# Patient Record
Sex: Female | Born: 1955 | Race: White | Hispanic: No | Marital: Married | Smoking: Former smoker
Health system: Southern US, Community
[De-identification: ages and names within clinical notes are randomized; demographics above are authoritative.]

## PROBLEM LIST (undated history)

## (undated) DIAGNOSIS — R5382 Chronic fatigue, unspecified: Secondary | ICD-10-CM

## (undated) DIAGNOSIS — E785 Hyperlipidemia, unspecified: Secondary | ICD-10-CM

## (undated) DIAGNOSIS — G43909 Migraine, unspecified, not intractable, without status migrainosus: Secondary | ICD-10-CM

## (undated) DIAGNOSIS — F32A Depression, unspecified: Secondary | ICD-10-CM

## (undated) DIAGNOSIS — F329 Major depressive disorder, single episode, unspecified: Secondary | ICD-10-CM

## (undated) DIAGNOSIS — M797 Fibromyalgia: Secondary | ICD-10-CM

## (undated) DIAGNOSIS — R7303 Prediabetes: Secondary | ICD-10-CM

## (undated) DIAGNOSIS — G4730 Sleep apnea, unspecified: Secondary | ICD-10-CM

## (undated) DIAGNOSIS — N6009 Solitary cyst of unspecified breast: Secondary | ICD-10-CM

## (undated) DIAGNOSIS — N920 Excessive and frequent menstruation with regular cycle: Secondary | ICD-10-CM

## (undated) DIAGNOSIS — I10 Essential (primary) hypertension: Secondary | ICD-10-CM

## (undated) HISTORY — DX: Essential (primary) hypertension: I10

## (undated) HISTORY — DX: Excessive and frequent menstruation with regular cycle: N92.0

## (undated) HISTORY — DX: Solitary cyst of unspecified breast: N60.09

## (undated) HISTORY — PX: TONSILLECTOMY: SUR1361

## (undated) HISTORY — DX: Hyperlipidemia, unspecified: E78.5

## (undated) HISTORY — DX: Depression, unspecified: F32.A

## (undated) HISTORY — DX: Major depressive disorder, single episode, unspecified: F32.9

## (undated) HISTORY — DX: Fibromyalgia: M79.7

## (undated) HISTORY — DX: Migraine, unspecified, not intractable, without status migrainosus: G43.909

## (undated) HISTORY — DX: Chronic fatigue, unspecified: R53.82

---

## 1997-12-27 ENCOUNTER — Ambulatory Visit (HOSPITAL_COMMUNITY): Admission: RE | Admit: 1997-12-27 | Discharge: 1997-12-27 | Payer: Self-pay | Admitting: Obstetrics & Gynecology

## 1998-08-23 HISTORY — PX: ABDOMINAL HYSTERECTOMY: SHX81

## 1998-11-27 ENCOUNTER — Other Ambulatory Visit: Admission: RE | Admit: 1998-11-27 | Discharge: 1998-11-27 | Payer: Self-pay | Admitting: Obstetrics & Gynecology

## 1999-01-22 ENCOUNTER — Inpatient Hospital Stay (HOSPITAL_COMMUNITY): Admission: RE | Admit: 1999-01-22 | Discharge: 1999-01-23 | Payer: Self-pay | Admitting: Obstetrics & Gynecology

## 2003-03-14 ENCOUNTER — Encounter: Admission: RE | Admit: 2003-03-14 | Discharge: 2003-03-14 | Payer: Self-pay

## 2003-10-09 ENCOUNTER — Emergency Department (HOSPITAL_COMMUNITY): Admission: EM | Admit: 2003-10-09 | Discharge: 2003-10-09 | Payer: Self-pay | Admitting: Emergency Medicine

## 2004-09-30 ENCOUNTER — Other Ambulatory Visit: Admission: RE | Admit: 2004-09-30 | Discharge: 2004-09-30 | Payer: Self-pay | Admitting: Obstetrics & Gynecology

## 2005-12-14 ENCOUNTER — Encounter: Admission: RE | Admit: 2005-12-14 | Discharge: 2005-12-14 | Payer: Self-pay

## 2006-11-21 ENCOUNTER — Emergency Department (HOSPITAL_COMMUNITY): Admission: EM | Admit: 2006-11-21 | Discharge: 2006-11-21 | Payer: Self-pay | Admitting: Emergency Medicine

## 2006-12-15 ENCOUNTER — Ambulatory Visit: Payer: Self-pay | Admitting: Gastroenterology

## 2006-12-16 ENCOUNTER — Encounter: Payer: Self-pay | Admitting: Gastroenterology

## 2006-12-16 LAB — CONVERTED CEMR LAB: Tissue Transglutaminase Ab, IgA: <3 units (ref <5)

## 2006-12-22 ENCOUNTER — Encounter: Admission: RE | Admit: 2006-12-22 | Discharge: 2006-12-22 | Payer: Self-pay | Admitting: Gastroenterology

## 2006-12-30 ENCOUNTER — Encounter: Payer: Self-pay | Admitting: Gastroenterology

## 2006-12-30 ENCOUNTER — Ambulatory Visit: Payer: Self-pay | Admitting: Gastroenterology

## 2007-02-13 ENCOUNTER — Ambulatory Visit: Payer: Self-pay | Admitting: Gastroenterology

## 2007-12-16 DIAGNOSIS — IMO0002 Reserved for concepts with insufficient information to code with codable children: Secondary | ICD-10-CM | POA: Insufficient documentation

## 2007-12-16 DIAGNOSIS — R7309 Other abnormal glucose: Secondary | ICD-10-CM

## 2007-12-16 DIAGNOSIS — IMO0001 Reserved for inherently not codable concepts without codable children: Secondary | ICD-10-CM | POA: Insufficient documentation

## 2007-12-16 DIAGNOSIS — E785 Hyperlipidemia, unspecified: Secondary | ICD-10-CM | POA: Insufficient documentation

## 2007-12-16 DIAGNOSIS — F341 Dysthymic disorder: Secondary | ICD-10-CM

## 2008-08-23 HISTORY — PX: TOTAL HIP ARTHROPLASTY: SHX124

## 2008-10-29 ENCOUNTER — Encounter: Admission: RE | Admit: 2008-10-29 | Discharge: 2008-10-29 | Payer: Self-pay | Admitting: Obstetrics and Gynecology

## 2010-09-13 ENCOUNTER — Encounter: Payer: Self-pay | Admitting: Gastroenterology

## 2011-01-05 NOTE — Assessment & Plan Note (Signed)
[facility]                         GASTROENTEROLOGY OFFICE NOTE   NAME:Bethel, KATALEENA HOLSAPPLE                       MRN:          [id number]  DATE:02/13/2007                            DOB:          11-10-1955    PROBLEM:  Diarrhea.   Ms. Molitor has returned for scheduled GI followup.  Colonoscopy on Dec 30, 2006 was remarkable only for a diminutive polyp.  Pathology showed  hyperplastic changes only.  Random biopsies were negative for  microscopic colitis.  Serologies for celiac sprue were also negative.   Ms. Bezdek reports cessation of her diarrhea with dietary changes.  She  appears to have loose stools when she eats large amounts of wheat and  sometimes chicken.  With dietary control her diarrhea has subsided  entirely.  Altogether she is feeling quite well.   PHYSICAL EXAMINATION:  VITAL SIGNS:  Pulse 88, blood pressure 120/72,  weight 195.   IMPRESSION:  Diarrhea.  This is certainly suggestive of irritable bowel  syndrome though symptoms also seem to be related to certain foodstuffs.   RECOMMENDATIONS:  No further GI workup.     Barbette Hair. Arlyce Dice, MD,FACG  Electronically Signed    RDK/MedQ  DD: 02/13/2007  DT: 02/13/2007  Job #: [id number]   cc:   Jonita Albee, M.D.  Allena Napoleon

## 2011-01-08 NOTE — Assessment & Plan Note (Signed)
[facility]                         GASTROENTEROLOGY OFFICE NOTE   NAME:Bebeau, SCOTT FIX                       MRN:          [id number]  DATE:12/15/2006                            DOB:          1955/09/27    PROBLEM:  1. Blood in stool.  2. Chronic diarrhea.  3. Intermittent right lower quadrant pain.   HISTORY:  Kaymarie is a pleasant 55 year old white female known to Dr.  Allena Napoleon and to Dr. Robert Bellow. She had an episode of rectal  bleeding on November 21, 2006, with bright red blood noted with diarrheal  stool. She went to the emergency room for evaluation. Labs were done and  were unremarkable with a normal hemoglobin of 14.8, hematocrit of 41.4,  MCV of 91, WBC of 10.1. CMET was likewise unremarkable. She also had CT  scan of the abdomen and pelvis because she had mentioned some right  lower quadrant abdominal pain. This was a negative study with oral and  IV contrast. She did have an indeterminate right hepatitic dome  hypodense lesion noted that is suspicious for a hemangioma, but MRI was  recommended. This lesion measures 2.3 x 1.9 cm.   In retrospect, the patient says she has had chronic problems with  diarrhea for at least the past year, normally having 3 to 4 loose bowel  movements per day. During that same period of time, she has also been  having intermittent right lower quadrant discomfort sometimes lasting  for a couple of days at a time. She had seen Dr. Alessandra Bevels for other  reasons and had been placed on what sounds like a gluten-free diet,  which she says was told was chosen on the basis of her blood type. She  is not aware that she has been tested for celiac disease and has not  been told that she has celiac disease, but feels that her symptoms are  much improved when she is on the no wheat, no grain diet. She had been  following this diet for several months with improvement of her symptoms,  had gone off of it and is now just  back on it for the past week or so.  She has not had any prior colonoscopy.   CURRENT MEDICATIONS:  1. Cymbalta 60 mg daily.  2. Benicar/hydrochlorothiazide 40/12.5 daily.  3. Zyrtec 10 q nightly.  4. Lipitor 20 q nightly.  5. Vitamins.  6. She takes Valtrex p.r.n.  7. Zyrtec p.r.n.  8. Mobic p.r.n.  9. Carisoprodol p.r.n.   ALLERGIES:  SULFA AND CODEINE.   PAST MEDICAL HISTORY:  1. Vaginal hysterectomy in 2000.  2. Tubal ligation in 1994.  3. She was diagnosed with fibromyalgia in 2003, but has not had any      recent problems.  4. Degenerative disc disease longterm.  5. Borderline diabetes, on no medications.  6. Hyperlipidemia.  7. Anxiety.  8. Seasonal allergies.  9. Depression.   FAMILY HISTORY:  Is pertinent for heart disease in her mother and  brother. Diabetes in mother and brother and alcoholism in uncle and a  cousin. No family history  of colon cancer, polyps or inflammatory bowel  disease. No family history of celiac disease that she is aware of.   SOCIAL HISTORY:  The patient is married. Has two children. Is employed  in [employer]. She is a smoker. Drinks alcohol socially.   REVIEW OF SYSTEMS:  Pertinent for chronic low back pain and arthritic  symptoms as well as seasonal allergy symptoms. GI as outlined above. GU:  Negative. CARDIOVASCULAR: Negative. PULMONARY: Negative. All other  review of systems reviewed in entirety and negative.   PHYSICAL EXAMINATION:  Well-developed, white female in no acute  distress. Height is 5 feet, 3.5 inches. Weight is 191.2, blood pressure  110/70, pulse in the 90s.  HEENT: Nontraumatic, normocephalic. EOMI. PERRLA. Sclerae anicteric.  NECK: Supple, without nodes.  CARDIOVASCULAR: Regular rate and rhythm with S1 and S2. There is no  murmur, rub or gallop.  PULMONARY: Clear to A&P.  ABDOMEN: Soft. She is basically nontender. There is no palpable mass or  hepatosplenomegaly. Bowel sounds are active.  RECTAL: Was not done  today. She was documented Heme positive in the  emergency room.  EXTREMITIES: Without clubbing, cyanosis or edema.  SKIN: Is benign with no rash or obvious lesions.  NEURO: Grossly nonfocal.   IMPRESSION:  68. A 55 year old white female with at least one year history of fairly      chronic diarrhea, recent episode of hematochezia, isolated and      intermittent right lower quadrant pain. Symptoms improved with      gluten-free diet, though no prior diagnosis of celiac disease.      Unclear whether her symptoms could be related to celiac disease,      underlying inflammatory bowel disease, occult colon lesion or a      microscopic colitis.  2. Right lobe liver lesion, probable hemangioma.   PLAN:  1. Schedule colonoscopy. If this is a negative examination, will need      random biopsies to rule out microscopic colitis.  2. Sprue panel. If markers are positive, will need to schedule EGD      with small bowel biopsy as      well.  3. Schedule MRI of the liver to assure that this right lobe liver      lesion is a hemangioma.      Mike Gip, PA-C  Electronically Signed      Barbette Hair. Arlyce Dice, MD,FACG  Electronically Signed   AE/MedQ  DD: 12/15/2006  DT: 12/15/2006  Job #: [id number]   cc:   Jonita Albee, M.D.  Allena Napoleon

## 2011-09-03 ENCOUNTER — Ambulatory Visit: Payer: Self-pay | Admitting: Rheumatology

## 2011-10-11 ENCOUNTER — Other Ambulatory Visit: Payer: Self-pay | Admitting: Family Medicine

## 2011-11-02 ENCOUNTER — Other Ambulatory Visit: Payer: Self-pay | Admitting: Physician Assistant

## 2011-11-04 ENCOUNTER — Ambulatory Visit (INDEPENDENT_AMBULATORY_CARE_PROVIDER_SITE_OTHER): Payer: BC Managed Care – PPO | Admitting: Family Medicine

## 2011-11-04 VITALS — BP 120/84 | HR 74 | Temp 98.5°F | Resp 18 | Ht 63.5 in | Wt 201.2 lb

## 2011-11-04 DIAGNOSIS — F419 Anxiety disorder, unspecified: Secondary | ICD-10-CM

## 2011-11-04 DIAGNOSIS — F411 Generalized anxiety disorder: Secondary | ICD-10-CM

## 2011-11-04 MED ORDER — CLONAZEPAM 0.5 MG PO TABS: ORAL_TABLET | ORAL | Status: AC

## 2011-11-04 NOTE — Progress Notes (Signed)
  Patient Name: Erika Burch Date of Birth: 10-Apr-1956 Medical Record Number: [id number] Gender: female Date of Encounter: 11/04/2011  History of Present Illness:  Erika Burch is a 56 y.o. very pleasant female patient who presents with the following:  Here today secondary to needing a RF of her klonopin.  She is stable on her klonopin .5 1/2 tablet 3x daily.  Her daughter Denny Peon has had a lot of problems lately with psycholocial problems and "psychogenic seizures."  Trinity currenly has about 2 drinks per night- this is a decrease from 3 drinks per day.  She is also trying to cut back on her smoking.  Overall she is stressed but feels that she is handling it ok- the klonopin helps.    She has lost about 10lbs so far through diet and exercise changes and is very pleased with her results.  Patient Active Problem List  Diagnoses  . HYPERLIPIDEMIA  . DEPRESSION/ANXIETY  . DEGENERATIVE DISC DISEASE  . FIBROMYALGIA  . DIABETES MELLITUS, BORDERLINE   No past medical history on file. No past surgical history on file. History  Substance Use Topics  . Smoking status: Current Everyday Smoker -- 1.0 packs/day for 13 years    Types: Cigarettes  . Smokeless tobacco: Never Used  . Alcohol Use: Not on file   No family history on file. Allergies  Allergen Reactions  . Aspirin Nausea And Vomiting  . Codeine   . Nickel Itching  . Sulfa Antibiotics Itching  . Tramadol Other (See Comments)    hallucinations    Medication list has been reviewed and updated.  Review of Systems: As per HPI- otherwise negative. Would like a 90d supply of her medication if possible.    Physical Examination: Filed Vitals:   11/04/11 1637  BP: 120/84  Pulse: 74  Temp: 98.5 F (36.9 C)  TempSrc: Oral  Resp: 18  Height: 5' 3.5" (1.613 m)  Weight: 201 lb 3.2 oz (91.264 kg)    Body mass index is 35.08 kg/(m^2).   GEN: WDWN, NAD, Non-toxic, Alert & Oriented x 3, obese HEENT: Atraumatic, Normocephalic.    Ears and Nose: No external deformity. EXTR: No clubbing/cyanosis/edema NEURO: Normal gait.  PSYCH: Normally interactive. Conversant. Not depressed or anxious appearing.  Calm demeanor.    Assessment and Plan: 1. Anxiety  clonazePAM (KLONOPIN) 0.5 MG tablet   Refilled her klonopin for 6 months today- she used 0.5mg - a half tablet TID.  She will plan to come in for her annual physical this summer or fall and we can check on her progress then- sooner if she has any worsening of her symptoms or changes

## 2011-11-09 ENCOUNTER — Other Ambulatory Visit: Payer: Self-pay | Admitting: Physician Assistant

## 2012-02-15 ENCOUNTER — Other Ambulatory Visit: Payer: Self-pay | Admitting: Physician Assistant

## 2013-01-29 ENCOUNTER — Telehealth: Payer: Self-pay | Admitting: Gastroenterology

## 2013-01-29 NOTE — Telephone Encounter (Signed)
Pt had colon done in May of 2008, a couple of polyps were removed but they were not adenomatous. Pt wants to know when she is due for a repeat colon. Please advise.

## 2013-01-30 NOTE — Telephone Encounter (Signed)
Spoke with pt and she is aware.

## 2013-01-30 NOTE — Telephone Encounter (Signed)
2018

## 2013-05-04 ENCOUNTER — Telehealth: Payer: Self-pay

## 2013-05-04 ENCOUNTER — Other Ambulatory Visit: Payer: BC Managed Care – PPO | Admitting: Certified Nurse Midwife

## 2013-05-04 DIAGNOSIS — Z1211 Encounter for screening for malignant neoplasm of colon: Secondary | ICD-10-CM

## 2013-05-04 NOTE — Telephone Encounter (Signed)
Patient notified of results.

## 2013-05-04 NOTE — Telephone Encounter (Signed)
IFOB neg LMTCB 

## 2013-05-04 NOTE — Telephone Encounter (Signed)
Patient returning Joy's call.  °

## 2013-08-06 ENCOUNTER — Encounter: Payer: Self-pay | Admitting: Certified Nurse Midwife

## 2013-08-07 ENCOUNTER — Ambulatory Visit: Payer: BC Managed Care – PPO | Admitting: Certified Nurse Midwife

## 2013-08-07 ENCOUNTER — Encounter: Payer: Self-pay | Admitting: Certified Nurse Midwife

## 2013-09-27 ENCOUNTER — Other Ambulatory Visit: Payer: Self-pay | Admitting: *Deleted

## 2013-09-27 MED ORDER — VALACYCLOVIR HCL 500 MG PO TABS: 500.0000 mg | ORAL_TABLET | ORAL | Status: DC | PRN

## 2013-09-27 NOTE — Telephone Encounter (Signed)
eScribe request from [facility] for refill on VALACYCLOVIR Last filled - 09/21/12, #30 X 11 Last AEX - 07/31/12 Next AEX - cancelled appt on 08/07/13.  Did not reschedule. Please advise refills.

## 2013-10-28 ENCOUNTER — Other Ambulatory Visit: Payer: Self-pay | Admitting: Certified Nurse Midwife

## 2013-10-29 NOTE — Telephone Encounter (Signed)
Patient needs to schedule AEX

## 2013-10-29 NOTE — Telephone Encounter (Signed)
Left Message To Call Back  

## 2013-10-30 NOTE — Telephone Encounter (Signed)
Scheduled patient for AEX 11/14/13 with DL Valtrex 500 mg #30/0 refills sent to last patient until AEX

## 2013-11-14 ENCOUNTER — Ambulatory Visit (INDEPENDENT_AMBULATORY_CARE_PROVIDER_SITE_OTHER): Payer: BC Managed Care – PPO | Admitting: Certified Nurse Midwife

## 2013-11-14 ENCOUNTER — Encounter: Payer: Self-pay | Admitting: Certified Nurse Midwife

## 2013-11-14 ENCOUNTER — Telehealth: Payer: Self-pay | Admitting: Certified Nurse Midwife

## 2013-11-14 VITALS — BP 130/90 | HR 84 | Resp 16 | Ht 63.25 in | Wt 207.0 lb

## 2013-11-14 DIAGNOSIS — B372 Candidiasis of skin and nail: Secondary | ICD-10-CM

## 2013-11-14 DIAGNOSIS — F1010 Alcohol abuse, uncomplicated: Secondary | ICD-10-CM

## 2013-11-14 DIAGNOSIS — Z0000 Encounter for general adult medical examination without abnormal findings: Secondary | ICD-10-CM

## 2013-11-14 DIAGNOSIS — Z01419 Encounter for gynecological examination (general) (routine) without abnormal findings: Secondary | ICD-10-CM

## 2013-11-14 LAB — POCT URINALYSIS DIPSTICK
BILIRUBIN UA: NEGATIVE
Blood, UA: NEGATIVE
Glucose, UA: NEGATIVE
Ketones, UA: NEGATIVE
LEUKOCYTES UA: NEGATIVE
NITRITE UA: NEGATIVE
PH UA: 5
PROTEIN UA: NEGATIVE
Urobilinogen, UA: NEGATIVE
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
## 2013-11-14 NOTE — Patient Instructions (Addendum)
EXERCISE AND DIET:  We recommended that you start or continue a regular exercise program for good health. Regular exercise means any activity that makes your heart beat faster and makes you sweat.  We recommend exercising at least 30 minutes per day at least 3 days a week, preferably 4 or 5.  We also recommend a diet low in fat and sugar.  Inactivity, poor dietary choices and obesity can cause diabetes, heart attack, stroke, and kidney damage, among others.    ALCOHOL AND SMOKING:  Women should limit their alcohol intake to no more than 7 drinks/beers/glasses of wine (combined, not each!) per week. Moderation of alcohol intake to this level decreases your risk of breast cancer and liver damage. And of course, no recreational drugs are part of a healthy lifestyle.  And absolutely no smoking or even second hand smoke. Most people know smoking can cause heart and lung diseases, but did you know it also contributes to weakening of your bones? Aging of your skin?  Yellowing of your teeth and nails?  CALCIUM AND VITAMIN D:  Adequate intake of calcium and Vitamin D are recommended.  The recommendations for exact amounts of these supplements seem to change often, but generally speaking 600 mg of calcium (either carbonate or citrate) and 800 units of Vitamin D per day seems prudent. Certain women may benefit from higher intake of Vitamin D.  If you are among these women, your doctor will have told you during your visit.    PAP SMEARS:  Pap smears, to check for cervical cancer or precancers,  have traditionally been done yearly, although recent scientific advances have shown that most women can have pap smears less often.  However, every woman still should have a physical exam from her gynecologist every year. It will include a breast check, inspection of the vulva and vagina to check for abnormal growths or skin changes, a visual exam of the cervix, and then an exam to evaluate the size and shape of the uterus and  ovaries.  And after 60 years of age, a rectal exam is indicated to check for rectal cancers. We will also provide age appropriate advice regarding health maintenance, like when you should have certain vaccines, screening for sexually transmitted diseases, bone density testing, colonoscopy, mammograms, etc.   MAMMOGRAMS:  All women over 40 years old should have a yearly mammogram. Many facilities now offer a "3D" mammogram, which may cost around $50 extra out of pocket. If possible,  we recommend you accept the option to have the 3D mammogram performed.  It both reduces the number of women who will be called back for extra views which then turn out to be normal, and it is better than the routine mammogram at detecting truly abnormal areas.    COLONOSCOPY:  Colonoscopy to screen for colon cancer is recommended for all women at age 50.  We know, you hate the idea of the prep.  We agree, BUT, having colon cancer and not knowing it is worse!!  Colon cancer so often starts as a polyp that can be seen and removed at colonscopy, which can quite literally save your life!  And if your first colonoscopy is normal and you have no family history of colon cancer, most women don't have to have it again for 10 years.  Once every ten years, you can do something that may end up saving your life, right?  We will be happy to help you get it scheduled when you are ready.    Be sure to check your insurance coverage so you understand how much it will cost.  It may be covered as a preventative service at no cost, but you should check your particular policy.     YOU are a strong woman and very special. You can make that step to AA !  DebbiYeast Infection of the Skin Some yeast on the skin is normal, but sometimes it causes an infection. If you have a yeast infection, it shows up as white or light brown patches on brown skin. You can see it better in the summer on tan skin. It causes light-colored holes in your suntan. It can happen  on any area of the body. This cannot be passed from person to person. HOME CARE  Scrub your skin daily with a dandruff shampoo. Your rash may take a couple weeks to get well.  Do not scratch or itch the rash. GET HELP RIGHT AWAY IF:   You get another infection from scratching. The skin may get warm, red, and may ooze fluid.  The infection does not seem to be getting better. MAKE SURE YOU:  Understand these instructions.  Will watch your condition.  Will get help right away if you are not doing well or get worse. Document Released: 07/22/2008 Document Revised: 11/01/2011 Document Reviewed: 07/22/2008 [facility] Patient Information 2014 [employer].

## 2013-11-15 NOTE — Progress Notes (Signed)
Reviewed personally.  M. Suzanne Brandonn Capelli, MD.  

## 2013-11-15 NOTE — Telephone Encounter (Signed)
Opened error

## 2013-12-02 ENCOUNTER — Other Ambulatory Visit: Payer: Self-pay | Admitting: Certified Nurse Midwife

## 2013-12-03 NOTE — Telephone Encounter (Signed)
Last refilled: 10/30/13 #30/0 rfs Last AEX: 11/14/13 with Ms. Lance Muss to send refills x1 year?  Please Advise.

## 2014-06-24 ENCOUNTER — Encounter: Payer: Self-pay | Admitting: Certified Nurse Midwife

## 2014-11-22 ENCOUNTER — Encounter: Payer: Self-pay | Admitting: Podiatry

## 2014-11-22 ENCOUNTER — Ambulatory Visit (INDEPENDENT_AMBULATORY_CARE_PROVIDER_SITE_OTHER): Payer: BLUE CROSS/BLUE SHIELD

## 2014-11-22 ENCOUNTER — Ambulatory Visit (INDEPENDENT_AMBULATORY_CARE_PROVIDER_SITE_OTHER): Payer: BLUE CROSS/BLUE SHIELD | Admitting: Podiatry

## 2014-11-22 VITALS — BP 117/75 | HR 117 | Resp 16 | Ht 63.5 in | Wt 210.0 lb

## 2014-11-22 DIAGNOSIS — M2012 Hallux valgus (acquired), left foot: Secondary | ICD-10-CM

## 2014-11-22 DIAGNOSIS — L600 Ingrowing nail: Secondary | ICD-10-CM

## 2014-11-22 NOTE — Progress Notes (Signed)
   Subjective:    Patient ID: Erika Burch, female    DOB: 1956/05/03, 59 y.o.   MRN: [id number]  HPI Toenail fungus, and the left great toe feels numb    Review of Systems  All other systems reviewed and are negative.      Objective:   Physical Exam        Assessment & Plan:

## 2014-11-22 NOTE — Patient Instructions (Signed)

## 2014-11-25 NOTE — Progress Notes (Signed)
Subjective:     Patient ID: Erika Burch, female   DOB: [birth year], 59 y.o.   MRN: [id number]  HPI patient presents stating I'm getting numbness and structural deformity of my left big toe joint and my nailbeds are yellow with some discoloration. Patient states that she's tried to soak them and use over-the-counter medications without relief   Review of Systems  All other systems reviewed and are negative.      Objective:   Physical Exam  Constitutional: She is oriented to person, place, and time.  Cardiovascular: Intact distal pulses.   Musculoskeletal: Normal range of motion.  Neurological: She is oriented to person, place, and time.  Skin: Skin is warm and dry.  Nursing note and vitals reviewed.  neurovascular status found to be intact with range of motion subtalar midtarsal joint within normal limits. Patient's noted to have hyperostosis medial aspect first metatarsal left and discoloration of the underlying nailbeds 1-5 both feet with some yellow friable type nailbeds. 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 At this point we'll start topical antifungal treatment and debridement and explained I do not recommend oral medication at the current time patient be seen back and also  we'll wider shoes in order to try to control structural deformity

## 2014-11-26 ENCOUNTER — Ambulatory Visit: Payer: BC Managed Care – PPO | Admitting: Certified Nurse Midwife

## 2014-11-27 ENCOUNTER — Ambulatory Visit
Admit: 2014-11-27 | Disposition: A | Discharge status: Home / Self Care | Payer: Self-pay | Attending: Family Medicine | Admitting: Family Medicine

## 2014-12-13 ENCOUNTER — Ambulatory Visit
Admit: 2014-12-13 | Disposition: A | Discharge status: Home / Self Care | Payer: Self-pay | Attending: Family Medicine | Admitting: Family Medicine

## 2014-12-17 ENCOUNTER — Telehealth: Payer: Self-pay | Admitting: *Deleted

## 2014-12-17 MED ORDER — CEPHALEXIN 500 MG PO CAPS: 500.0000 mg | ORAL_CAPSULE | Freq: Three times a day (TID) | ORAL | Status: DC

## 2014-12-17 NOTE — Telephone Encounter (Signed)
Patient saw dr Paulla Dolly for nail debridement , and her toe is red and swollen , has appointment on Thursday morning with dr Jacqualyn Posey can I have a antibiotic to get started on. Called in keflex to pharmacy

## 2014-12-19 ENCOUNTER — Ambulatory Visit (INDEPENDENT_AMBULATORY_CARE_PROVIDER_SITE_OTHER): Payer: BLUE CROSS/BLUE SHIELD | Admitting: Podiatry

## 2014-12-19 VITALS — BP 153/105 | HR 116 | Resp 16

## 2014-12-19 DIAGNOSIS — L089 Local infection of the skin and subcutaneous tissue, unspecified: Secondary | ICD-10-CM | POA: Diagnosis not present

## 2014-12-19 NOTE — Patient Instructions (Signed)
Switch to epsom salt soaks Finish course of antibiotics.  Monitor for any signs/symptoms of infection. Call the office immediately if any occur or go directly to the emergency room. Call with any questions/concerns.  Soak Instructions    THE DAY AFTER THE PROCEDURE  Place 1/4 cup of epsom salts in a quart of warm tap water.  Submerge your foot or feet with outer bandage intact for the initial soak; this will allow the bandage to become moist and wet for easy lift off.  Once you remove your bandage, continue to soak in the solution for 20 minutes.  This soak should be done twice a day.  Next, remove your foot or feet from solution, blot dry the affected area and cover.  You may use a band aid large enough to cover the area or use gauze and tape.  Apply other medications to the area as directed by the doctor such as polysporin neosporin.  IF YOUR SKIN BECOMES IRRITATED WHILE USING THESE INSTRUCTIONS, IT IS OKAY TO SWITCH TO  WHITE VINEGAR AND WATER. Or you may use antibacterial soap and water to keep the toe clean  Monitor for any signs/symptoms of infection. Call the office immediately if any occur or go directly to the emergency room. Call with any questions/concerns.

## 2014-12-23 NOTE — Progress Notes (Signed)
Patient ID: Erika Burch, female   DOB: May 19, 1956, 59 y.o.   MRN: [id number]  Subjective: Patient presents to the office today for follow up status post left big toenail removal. She states that she was doing well up until about a week ago she started having increased redness and pain to the toenail procedure site. She did call the office and Keflex was called in. She states that since starting the antibiotics her symptoms have improved although still somewhat red. She's been continuing to soaking antibacterial soap soaks daily. She denies any drainage or purulence. Denies any red streaking. No other complaints at this time.  Objective: AAO 3, NAD DP/PT pulses palpable b/l, CRT < 3 sec Protective sensation intact with SWMF Status post total nail removal to the left hallux. There is small amount erythema on the proximal nail border however there is no ascending cellulitis. There is nervous palpation gently over the nail bed. There is no areas of fluctuance or crepitus. No drainage or purulence expressed. A scab is formed over the nail bed. No other areas of tenderness to bilateral reduction raise. No other areas of edema, erythema, increase in warmth. No other open lesions or pre-ulcerative lesions No pain with calf compression, swelling, warmth, erythema.   Assessment: 59 year old female with apparent resolving infection s/p left hallux nail removal  Plan: -Treatment options were discussed with patient include alternatives, risks, complications -Recommended incision and antibiotic ointment and a Band-Aid daily after soaking in Epson salt soaks twice a day. Can leave the area uncover night. Finish course of antibiotics. Monitoring signs or symptoms of worsening infection and directed to call the office immediately if any occur or go to the ER. -Nail bed was debrided -Follow-up in 2 weeks at the symptoms are not completely resolved or sooner if any problems are to arise. In the meantime call the  office with any questions or concerns or any changes symptoms.

## 2015-01-02 ENCOUNTER — Ambulatory Visit: Payer: BLUE CROSS/BLUE SHIELD | Admitting: Podiatry

## 2015-01-03 ENCOUNTER — Ambulatory Visit: Payer: BLUE CROSS/BLUE SHIELD | Admitting: Podiatry

## 2015-01-03 ENCOUNTER — Other Ambulatory Visit: Payer: Self-pay | Admitting: Certified Nurse Midwife

## 2015-01-03 NOTE — Telephone Encounter (Signed)
Medication refill request: Valtrex  Last AEX:  11/14/13 DL Next AEX: not scheduled  Last MMG (if hormonal medication request): 12/13/14 BIRADS3:Probably Benign  Refill authorized: 12/03/13 #30tabs/ 12 R. Today #30/0R?

## 2015-01-03 NOTE — Telephone Encounter (Signed)
Needs schedule aex, one refill authorized

## 2015-02-07 ENCOUNTER — Other Ambulatory Visit: Payer: Self-pay | Admitting: Certified Nurse Midwife

## 2015-02-07 NOTE — Telephone Encounter (Signed)
Medication refill request: Valtrex 500 mg  Last AEX: 11/14/13 with DL  Next AEX: No AEX scheduled Last MMG (if hormonal medication request): n/a Refill authorized: 0  Called and s/w patient to try and schedule her for AEX since she is due. Patient says she no longer lives in [place] and stays in [place] now. She asked if we could have pharmacy forward this rx to Eulogio Bear, MD in [place].  Rx denied with instruction to forward refill to Dr. Dema Severin, routed to Ms. Delorse Limber.

## 2015-05-22 ENCOUNTER — Other Ambulatory Visit: Payer: Self-pay | Admitting: Family Medicine

## 2015-05-22 DIAGNOSIS — R928 Other abnormal and inconclusive findings on diagnostic imaging of breast: Secondary | ICD-10-CM

## 2016-04-22 ENCOUNTER — Other Ambulatory Visit: Payer: Self-pay | Admitting: Family Medicine

## 2016-04-22 DIAGNOSIS — R928 Other abnormal and inconclusive findings on diagnostic imaging of breast: Secondary | ICD-10-CM

## 2016-05-21 ENCOUNTER — Ambulatory Visit
Admission: RE | Admit: 2016-05-21 | Discharge: 2016-05-21 | Disposition: A | Discharge status: Home / Self Care | Payer: Medicare HMO | Source: Ambulatory Visit | Attending: Family Medicine | Admitting: Family Medicine

## 2016-05-21 DIAGNOSIS — Z1231 Encounter for screening mammogram for malignant neoplasm of breast: Secondary | ICD-10-CM | POA: Diagnosis present

## 2016-05-21 DIAGNOSIS — R928 Other abnormal and inconclusive findings on diagnostic imaging of breast: Secondary | ICD-10-CM

## 2016-11-18 ENCOUNTER — Encounter: Payer: Self-pay | Admitting: Gastroenterology

## 2017-08-24 ENCOUNTER — Other Ambulatory Visit: Payer: Self-pay | Admitting: Family Medicine

## 2017-08-24 DIAGNOSIS — Z1231 Encounter for screening mammogram for malignant neoplasm of breast: Secondary | ICD-10-CM

## 2017-09-07 ENCOUNTER — Ambulatory Visit: Payer: Medicare HMO | Admitting: Obstetrics and Gynecology

## 2017-09-07 ENCOUNTER — Other Ambulatory Visit (HOSPITAL_COMMUNITY)
Admission: RE | Admit: 2017-09-07 | Discharge: 2017-09-07 | Disposition: A | Discharge status: Home / Self Care | Payer: Medicare HMO | Source: Ambulatory Visit | Attending: Obstetrics and Gynecology | Admitting: Obstetrics and Gynecology

## 2017-09-07 ENCOUNTER — Encounter: Payer: Self-pay | Admitting: Obstetrics and Gynecology

## 2017-09-07 DIAGNOSIS — Z01419 Encounter for gynecological examination (general) (routine) without abnormal findings: Secondary | ICD-10-CM

## 2017-09-07 DIAGNOSIS — N898 Other specified noninflammatory disorders of vagina: Secondary | ICD-10-CM

## 2017-09-07 DIAGNOSIS — Z01411 Encounter for gynecological examination (general) (routine) with abnormal findings: Secondary | ICD-10-CM | POA: Diagnosis not present

## 2017-09-07 DIAGNOSIS — N760 Acute vaginitis: Secondary | ICD-10-CM | POA: Insufficient documentation

## 2017-09-07 DIAGNOSIS — B9689 Other specified bacterial agents as the cause of diseases classified elsewhere: Secondary | ICD-10-CM

## 2017-09-07 MED ORDER — NYSTATIN-TRIAMCINOLONE 100000-0.1 UNIT/GM-% EX OINT: 1.0000 "application " | TOPICAL_OINTMENT | Freq: Two times a day (BID) | CUTANEOUS | 0 refills | Status: AC

## 2017-09-07 NOTE — Patient Instructions (Signed)
Nystatin; Triamcinolone cream or ointment What is this medicine? NYSTATIN; TRIAMCINOLONE (nye STAT in; trye am SIN oh lone) is a combination of an antifungal medicine and a steroid. It is used to treat certain kinds of fungal or yeast infections of the skin. This medicine may be used for other purposes; ask your health care provider or pharmacist if you have questions. COMMON BRAND NAME(S): Myco-Triacet-II, Mycogen-II, Mycolog II, Mytrex, N.T.A. What should I tell my health care provider before I take this medicine? They need to know if you have any of these conditions: -large areas of burned or damaged skin -skin wasting or thinning -peripheral vascular disease or poor circulation -an unusual or allergic reaction to nystatin, triamcinolone, other corticosteroids, other medicines, foods, dyes, or preservatives -pregnant or trying to get pregnant -breast-feeding How should I use this medicine? This medicine is for external use only. Do not take by mouth. Follow the directions on the prescription label. Wash your hands before and after use. If treating hand or nail infections, wash hands before use only. Apply a thin layer of this medicine to the affected area and rub in gently. Do not use on healthy skin or over large areas of skin. Do not get this medicine in your eyes. If you do, rinse out with plenty of cool tap water. When applying to the groin area, apply a limited amount and do not use for longer than 2 weeks unless directed to by your doctor or health care professional. Do not cover or wrap the treated area with an airtight bandage (such as a plastic bandage). Use the full course of treatment prescribed, even if you think the infection is getting better. Use at regular intervals. Do not use your medicine more often than directed. Do not use this medicine for any condition other than the one for which it was prescribed. Talk to your pediatrician regarding the use of this medicine in children.  While this drug may be prescribed for selected conditions, precautions do apply. Children being treated in the diaper area should not wear tight-fitting diapers or plastic pants. Elderly patients are more likely to have damaged skin through aging, and this may increase side effects. This medicine should only be used for brief periods and infrequently in older patients. Overdosage: If you think you have taken too much of this medicine contact a poison control center or emergency room at once. NOTE: This medicine is only for you. Do not share this medicine with others. What if I miss a dose? If you miss a dose, use it as soon as you can. If it is almost time for your next dose, use only that dose. Do not use double or extra doses. What may interact with this medicine? Interactions are not expected. Do not use any other skin products on the affected area without telling your doctor or health care professional. This list may not describe all possible interactions. Give your health care provider a list of all the medicines, herbs, non-prescription drugs, or dietary supplements you use. Also tell them if you smoke, drink alcohol, or use illegal drugs. Some items may interact with your medicine. What should I watch for while using this medicine? Tell your doctor or health care professional if your symptoms do not start to get better within 1 week when treating the groin area or within 2 weeks when treating the feet. . Tell your doctor or health care professional if you develop sores or blisters that do not heal properly. If your skin  infection returns after stopping this medicine, contact your doctor or health care professional. If you are using this medicine to treat an infection in the groin area, do not wear underwear that is tight-fitting or made from synthetic fibers such as rayon or nylon. Instead, wear loose-fitting, cotton underwear. Also dry the area completely after bathing. What side effects may I  notice from receiving this medicine? Side effects that you should report to your doctor or health care professional as soon as possible: -burning or itching of the skin -dark red spots on the skin -loss of feeling on skin -painful, red, pus-filled blisters in hair follicles -skin infection -thinning of the skin or sunburn: more likely if applied to the face Side effects that usually do not require medical attention (report to your doctor or health care professional if they continue or are bothersome): -dry or peeling skin -skin irritation This list may not describe all possible side effects. Call your doctor for medical advice about side effects. You may report side effects to FDA at 1-800-FDA-1088. Where should I keep my medicine? Keep out of the reach of children. Store at room temperature between 15 and 30 degrees C (59 and 86 degrees F). Do not freeze. Throw away any unused medicine after the expiration date. NOTE: This sheet is a summary. It may not cover all possible information. If you have questions about this medicine, talk to your doctor, pharmacist, or health care provider.  2018 Elsevier/Gold Standard (2008-03-01 17:29:26)

## 2017-09-07 NOTE — Progress Notes (Signed)
Mammogram scheduled for 09/12/2017 Vaginal itching for couple of days

## 2017-09-08 LAB — CERVICOVAGINAL ANCILLARY ONLY
Bacterial vaginitis: POSITIVE — AB
CHLAMYDIA, DNA PROBE: NEGATIVE
Candida vaginitis: NEGATIVE
Neisseria Gonorrhea: NEGATIVE
Trichomonas: NEGATIVE

## 2017-09-09 ENCOUNTER — Telehealth: Payer: Self-pay | Admitting: Obstetrics and Gynecology

## 2017-09-09 DIAGNOSIS — B9689 Other specified bacterial agents as the cause of diseases classified elsewhere: Secondary | ICD-10-CM | POA: Insufficient documentation

## 2017-09-09 DIAGNOSIS — N760 Acute vaginitis: Secondary | ICD-10-CM

## 2017-09-09 MED ORDER — METRONIDAZOLE 500 MG PO TABS: 500.0000 mg | ORAL_TABLET | Freq: Two times a day (BID) | ORAL | 0 refills | Status: AC

## 2017-09-09 NOTE — Telephone Encounter (Signed)
TC to patient to notify of wet prep results and Rx sent to her pharmacy for Flagyl 500 mg BID x 7 days. Patient verbalized an understanding of the plan of care and agrees.  Laury Deep, MSN, CNM 09/09/2017 2:16 PM

## 2017-09-09 NOTE — Progress Notes (Signed)
GYNECOLOGY CLINIC ANNUAL PREVENTATIVE CARE ENCOUNTER NOTE  Subjective:   Erika Burch is a 62 y.o. [phone] female here for a routine annual gynecologic exam.  Current complaints: vaginal irritation.   Denies abnormal vaginal bleeding, discharge, pelvic pain, problems with intercourse or other gynecologic concerns.    Gynecologic History Patient's last menstrual period was 08/23/1998. -- S/P hysterectomy x 19 years Contraception: status post hysterectomy -- "not sexually active" Last Pap: 2000. Results were: normal per pt Last mammogram: 2018, scheduled for one 09/12/2017. Results were: normal  Obstetric History OB History  Gravida Para Term Preterm AB Living  4 2 2   2 2   SAB TAB Ectopic Multiple Live Births  2       2    # Outcome Date GA Lbr Len/2nd Weight Sex Delivery Anes PTL Lv  4 SAB           3 SAB           2 Term     F CS-Classical   LIV  1 Term     M Vag-Spont   LIV      Past Medical History:  Diagnosis Date  . Breast cyst    left  . Chronic fatigue   . Depression   . Fibromyalgia   . Hyperlipidemia   . Hypertension   . Menorrhagia   . 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 ABDOMINAL HYSTERECTOMY  2000   TAH, has ovaries  . CESAREAN SECTION    . TONSILLECTOMY    . TOTAL HIP ARTHROPLASTY Right 2010    Current Outpatient Medications on File Prior to Visit  Medication Sig Dispense Refill  . amLODipine (NORVASC) 5 MG tablet Take 5 mg by mouth daily.    Marland Kitchen atorvastatin (LIPITOR) 20 MG tablet Take 1 tablet (20 mg total) by mouth daily. NEEDS OFFICE VISIT/LABS FOR REFILLS 30 tablet 0  . buPROPion (WELLBUTRIN) 75 MG tablet Take 75 mg by mouth daily.    . clonazePAM (KLONOPIN) 0.5 MG tablet 1/2 tablet 3 times daily 135 tablet 1  . DULoxetine (CYMBALTA) 60 MG capsule Take 60 mg by mouth daily.    . ergocalciferol (VITAMIN D2) 50000 UNITS capsule Take 50,000 Units by mouth every 30 (thirty) days.    . fluticasone  (FLONASE) 50 MCG/ACT nasal spray Place into both nostrils daily.    . folic acid (FOLVITE) 016 MCG tablet Take 400 mcg by mouth daily.    . hydrochlorothiazide (HYDRODIURIL) 25 MG tablet Take 25 mg by mouth daily.    Marland Kitchen HYDROcodone-acetaminophen (VICODIN) 5-500 MG per tablet Take 1 tablet by mouth every 6 (six) hours as needed.    . loratadine (CLARITIN) 10 MG tablet Take 10 mg by mouth daily.    . valACYclovir (VALTREX) 500 MG tablet TAKE 1 TABLET BY MOUTH AS NEEDED 30 tablet 0  . cephALEXin (KEFLEX) 500 MG capsule Take 1 capsule (500 mg total) by mouth 3 (three) times daily. (Patient not taking: Reported on 09/07/2017) 30 capsule 0   No current facility-administered medications on file prior to visit.     Allergies  Allergen Reactions  . Aspirin Nausea And Vomiting  . Codeine   . Losartan Cough  . Nickel Itching  . Other     opiates  . Sulfa Antibiotics Itching  . Tramadol Other (See Comments)    hallucinations    Social History   Socioeconomic History  . Marital  status: Married    Spouse name: Not on file  . Number of children: Not on file  . Years of education: Not on file  . Highest education level: Not on file  Social Needs  . Financial resource strain: Not on file  . Food insecurity - worry: Not on file  . Food insecurity - inability: Not on file  . Transportation needs - medical: Not on file  . Transportation needs - non-medical: Not on file  Occupational History  . Not on file  Tobacco Use  . Smoking status: Current Every Day Smoker    Packs/day: 1.00    Years: 13.00    Pack years: 13.00    Types: Cigarettes  . Smokeless tobacco: Never Used  . Tobacco comment: vape  Substance and Sexual Activity  . Alcohol use: Yes    Alcohol/week: 15.0 oz    Types: 30 drink(s) per week  . Drug use: No  . Sexual activity: No    Partners: Male    Birth control/protection: Surgical    Comment: TAH has ovaries  Other Topics Concern  . Not on file  Social History  Narrative  . Not on file    Family History  Problem Relation Age of Onset  . Diabetes Mother   . Heart attack Mother   . Mesothelioma Father   . Heart attack Father   . Hypertension Sister   . Diabetes Sister   . Heart attack Sister   . Diabetes Brother   . Heart attack Brother   . Diabetes Maternal Grandmother     The following portions of the patient's history were reviewed and updated as appropriate: allergies, current medications, past family history, past medical history, past social history, past surgical history and problem list.  Review of Systems A comprehensive review of systems was negative except for: Genitourinary: positive for vaginal irritation   Objective:  BP 119/67   Pulse 72   Wt 248 lb (112.5 kg)   LMP 08/23/1998   BMI 43.24 kg/m  CONSTITUTIONAL: Well-developed, well-nourished female in no acute distress.  HENT:  Normocephalic, atraumatic, External right and left ear normal. Oropharynx is clear and moist EYES: Conjunctivae and EOM are normal. Pupils are equal, round, and reactive to light. No scleral icterus.  NECK: Normal range of motion, supple, no masses.  Normal thyroid.  SKIN: Skin is warm and dry. No rash noted. Not diaphoretic. No erythema. No pallor. [place]: Alert and oriented to person, place, and time. Normal reflexes, muscle tone coordination. No cranial nerve deficit noted. PSYCHIATRIC: Normal mood and affect. Normal behavior. Normal judgment and thought content. CARDIOVASCULAR: Normal heart rate noted, regular rhythm RESPIRATORY: Clear to auscultation bilaterally. Effort and breath sounds normal, no problems with respiration noted. BREASTS: Symmetric in size. No masses, skin changes, nipple drainage, or lymphadenopathy. ABDOMEN: Soft, moderate pannus, normal bowel sounds, no distention noted.  No tenderness, rebound or guarding.  PELVIC: Normal appearing external genitalia; normal appearing vaginal mucosa and cervix.  No abnormal discharge  noted.  Pap smear obtained.  Normal uterine size, no other palpable masses, no uterine or adnexal tenderness. MUSCULOSKELETAL: Normal range of motion. No tenderness.  No cyanosis, clubbing, or edema.  2+ distal pulses.   Assessment:  Annual gynecologic examination without pap smear; wet prep performed   Plan:   Will follow up results of wet prep and manage accordingly. Mammogram scheduled for 09/12/2017 Routine preventative health maintenance measures emphasized. Please refer to After Visit Summary for other counseling recommendations.  Laury Deep, CNM  09/07/2017 2:03 PM

## 2017-09-12 ENCOUNTER — Ambulatory Visit
Admission: RE | Admit: 2017-09-12 | Discharge: 2017-09-12 | Disposition: A | Discharge status: Home / Self Care | Payer: Medicare HMO | Source: Ambulatory Visit | Attending: Family Medicine | Admitting: Family Medicine

## 2017-09-12 DIAGNOSIS — Z1231 Encounter for screening mammogram for malignant neoplasm of breast: Secondary | ICD-10-CM | POA: Diagnosis not present

## 2017-09-14 ENCOUNTER — Other Ambulatory Visit: Payer: Self-pay

## 2017-09-14 MED ORDER — METRONIDAZOLE 0.75 % VA GEL: 1.0000 | Freq: Two times a day (BID) | VAGINAL | 0 refills | Status: AC

## 2017-09-14 NOTE — Telephone Encounter (Signed)
Patient called stating she was having some neck and back pain and she thinks it could be related to flagyl that was called into her pharmacy for BV. She is requesting another medication be called into the pharmacy. Per Dr.Newton we can call metrogel for patient to used instead of flagyl. Patient medication called into her [place].

## 2018-07-05 ENCOUNTER — Encounter: Payer: Self-pay | Admitting: Radiology

## 2018-08-31 ENCOUNTER — Emergency Department
Admission: EM | Admit: 2018-08-31 | Discharge: 2018-08-31 | Disposition: A | Discharge status: Home / Self Care | Payer: Medicare HMO | Attending: Emergency Medicine | Admitting: Emergency Medicine

## 2018-08-31 ENCOUNTER — Emergency Department: Payer: Medicare HMO

## 2018-08-31 ENCOUNTER — Encounter: Payer: Self-pay | Admitting: Emergency Medicine

## 2018-08-31 DIAGNOSIS — M79602 Pain in left arm: Secondary | ICD-10-CM

## 2018-08-31 DIAGNOSIS — M79622 Pain in left upper arm: Secondary | ICD-10-CM | POA: Diagnosis not present

## 2018-08-31 DIAGNOSIS — F17210 Nicotine dependence, cigarettes, uncomplicated: Secondary | ICD-10-CM | POA: Insufficient documentation

## 2018-08-31 DIAGNOSIS — I10 Essential (primary) hypertension: Secondary | ICD-10-CM | POA: Diagnosis not present

## 2018-08-31 MED ORDER — IBUPROFEN 400 MG PO TABS
400.0000 mg | ORAL_TABLET | Freq: Once | ORAL | Status: AC
  Administered 2018-08-31: 400 mg via ORAL
  Filled 2018-08-31: qty 1

## 2018-08-31 NOTE — ED Provider Notes (Addendum)
[facility] Emergency Department Provider Note  ____________________________________________  Time seen: Approximately 8:31 PM  I have reviewed the triage vital signs and the nursing notes.   HISTORY  Chief Complaint Arm Pain    HPI Erika Burch is a 63 y.o. female with a history of HTN, HL, presenting for left upper extremity pain.  The patient reports that she woke up this morning with a dull ache in the upper part of her left arm.  She did not have any associated chest pain, shortness of breath, palpitations, lightheadedness or syncope.  She did not have any trauma or new strain.  She did not have any overlying skin changes.  She tried Tylenol and heating pad without improvement and a cold pack made it worse.  She denies any neck pain, numbness tingling or weakness.  SH: Former smoker.  Now vaping.  Past Medical History:  Diagnosis Date  . Breast cyst    left  . Chronic fatigue   . Depression   . Fibromyalgia   . Hyperlipidemia   . Hypertension   . Menorrhagia   . Migraines    without aura    Patient Active Problem List   Diagnosis Date Noted  . Bacterial vaginitis 09/09/2017  . Vaginal irritation 09/07/2017  . Encounter for well woman exam with routine gynecological exam 09/07/2017  . Alcohol abuse 11/14/2013    Class: History of  . HYPERLIPIDEMIA 12/16/2007  . DEPRESSION/ANXIETY 12/16/2007  . DEGENERATIVE DISC DISEASE 12/16/2007  . FIBROMYALGIA 12/16/2007  . DIABETES MELLITUS, BORDERLINE 12/16/2007    Past Surgical History:  Procedure Laterality Date  . ABDOMINAL HYSTERECTOMY  2000   TAH, has ovaries  . CESAREAN SECTION    . TONSILLECTOMY    . TOTAL HIP ARTHROPLASTY Right 2010    Current Outpatient Rx  . Order #: [id number] Class: Historical Med  . Order #: [id number] Class: Normal  . Order #: [id number] Class: Historical Med  . Order #: [id number] Class: Normal  . Order #: [id number] Class: Print  . Order #: [id number] Class: Historical Med   . Order #: [id number] Class: Historical Med  . Order #: [id number] Class: Historical Med  . Order #: [id number] Class: Historical Med  . Order #: [id number] Class: Historical Med  . Order #: [id number] Class: Historical Med  . Order #: [id number] Class: Historical Med  . Order #: [id number] Class: Normal  . Order #: [id number] Class: Normal  . Order #: [id number] Class: Normal  . Order #: [id number] Class: Normal    Allergies Aspirin; Codeine; Losartan; Nickel; Other; Sulfa antibiotics; and Tramadol  Family History  Problem Relation Age of Onset  . Diabetes Mother   . Heart attack Mother   . Mesothelioma Father   . Heart attack Father   . Hypertension Sister   . Diabetes Sister   . Heart attack Sister   . Diabetes Brother   . Heart attack Brother   . Diabetes Maternal Grandmother     Social History Social History   Tobacco Use  . Smoking status: Current Every Day Smoker    Packs/day: 1.00    Years: 13.00    Pack years: 13.00    Types: Cigarettes  . Smokeless tobacco: Never Used  . Tobacco comment: vape  Substance Use Topics  . Alcohol use: Yes    Alcohol/week: 30.0 standard drinks    Types: 30 drink(s) per week  . Drug use: No    Review of Systems Constitutional: No fever/chills.  No lightheadedness or syncope.  No trauma. Eyes: No visual changes. ENT: No sore throat.  No congestion or rhinorrhea. Cardiovascular: Denies chest pain. Denies palpitations. Respiratory: Denies shortness of breath.  No cough. Gastrointestinal: No abdominal pain.  No nausea, no vomiting.  No diarrhea.  No constipation. Genitourinary: Negative for dysuria. Musculoskeletal: Negative for back pain.  No neck pain.  Positive left upper extremity pain. Skin: Negative for rash. Neurological: Negative for headaches. No focal numbness, tingling or weakness.     ____________________________________________   PHYSICAL EXAM:  VITAL SIGNS: ED Triage Vitals  Enc Vitals Group     BP 08/31/18 1701 (!) 124/96      Pulse Rate 08/31/18 1701 96     Resp 08/31/18 1701 18     Temp 08/31/18 1701 98.9 F (37.2 C)     Temp Source 08/31/18 1701 Oral     SpO2 08/31/18 1701 94 %     Weight 08/31/18 1702 234 lb (106.1 kg)     Height 08/31/18 1702 5\' 4"  (1.626 m)     Head Circumference --      Peak Flow --      Pain Score 08/31/18 1702 7     Pain Loc --      Pain Edu? --      Excl. in [place]? --     Constitutional: Alert and oriented. Answers questions appropriately.  Chronically ill-appearing. Eyes: Conjunctivae are normal.  EOMI. No scleral icterus. Head: Atraumatic. Nose: No congestion/rhinnorhea. Mouth/Throat: Mucous membranes are moist.  Neck: No stridor.  Supple.  No JVD.  No meningismus. Cardiovascular: Normal rate, regular rhythm. No murmurs, rubs or gallops.  Respiratory: Normal respiratory effort.  No accessory muscle use or retractions. Lungs CTAB.  No wheezes, rales or ronchi. Gastrointestinal: Obese.  Soft, nontender and nondistended.  No guarding or rebound.  No peritoneal signs. Musculoskeletal: The patient has full range of motion of the left wrist, elbow and shoulder.  When I palpate the lower part of the deltoid, it reproduces the patient's pain.  She has no overlying skin changes.  She has no evidence of swelling in the left upper extremity.  The left radial pulse is normal.  She has 5 out of 5 grip strength in the left upper extremity and a normal sensation to light touch.   Neurologic:  A&Ox3.  Speech is clear.  Face and smile are symmetric.  EOMI.  Moves all extremities well. Skin:  Skin is warm, dry and intact. No rash noted.   ____________________________________________   LABS (all labs ordered are listed, but only abnormal results are displayed)  Labs Reviewed - No data to display ____________________________________________  EKG  ED ECG REPORT I, Anne-Caroline Mariea Clonts, the attending physician, personally viewed and interpreted this ECG.   Date: 08/31/2018  EKG Time: 2106   Rate: 89  Rhythm: normal sinus rhythm  Axis: leftward  Intervals:none  ST&T Change: No STEMI  ____________________________________________  RADIOLOGY  US Venous Img Upper Uni Left  Result Date: 08/31/2018 CLINICAL DATA:  Acute left arm pain EXAM: LEFT UPPER EXTREMITY VENOUS DOPPLER ULTRASOUND TECHNIQUE: Gray-scale sonography with graded compression, as well as color Doppler and duplex ultrasound were performed to evaluate the upper extremity deep venous system from the level of the subclavian vein and including the jugular, axillary, basilic, radial, ulnar and upper cephalic vein. Spectral Doppler was utilized to evaluate flow at rest and with distal augmentation maneuvers. COMPARISON:  None. FINDINGS: Contralateral Subclavian Vein: Respiratory phasicity is normal and symmetric with the symptomatic side. No evidence of thrombus. Normal compressibility. Internal Jugular Vein: No evidence of thrombus.  Normal compressibility, respiratory phasicity and response to augmentation. Subclavian Vein: No evidence of thrombus. Normal compressibility, respiratory phasicity and response to augmentation. Axillary Vein: No evidence of thrombus. Normal compressibility, respiratory phasicity and response to augmentation. Cephalic Vein: No evidence of thrombus. Normal compressibility, respiratory phasicity and response to augmentation. Basilic Vein: No evidence of thrombus. Normal compressibility, respiratory phasicity and response to augmentation. Brachial Veins: No evidence of thrombus. Normal compressibility, respiratory phasicity and response to augmentation. Radial Veins: No evidence of thrombus. Normal compressibility, respiratory phasicity and response to augmentation. Ulnar Veins: No evidence of thrombus. Normal compressibility, respiratory phasicity and response to augmentation. Venous Reflux:  None visualized. Other Findings:  None visualized. IMPRESSION: No evidence of DVT within the left upper extremity.  Electronically Signed   By: Jerilynn Mages.  Shick M.D.   On: 08/31/2018 18:09    ____________________________________________   PROCEDURES  Procedure(s) performed: None  Procedures  Critical Care performed: No ____________________________________________   INITIAL IMPRESSION / ASSESSMENT AND PLAN / ED COURSE  Pertinent labs & imaging results that were available during my care of the patient were reviewed by me and considered in my medical decision making (see chart for details).  63 y.o. female with a history of hypertension hyperlipidemia presenting with focal left upper extremity pain without any other associated symptoms.  Overall, the patient is hemodynamically stable.  I will get a screening EKG to rule out cardiac cause of her symptoms, but this is unlikely.  She more likely has a musculoskeletal strain.  I have given her expectant management as well as multiple modalities that she can try to improve her pain.  Plan discharge.  Follow-up instructions as well as return precautions were discussed.  ____________________________________________  FINAL CLINICAL IMPRESSION(S) / ED DIAGNOSES  Final diagnoses:  Left upper arm pain         NEW MEDICATIONS STARTED DURING THIS VISIT:  Discharge Medication List as of 08/31/2018  8:30 PM        Eula Listen, MD 08/31/18 2149    Eula Listen, MD 08/31/18 2151

## 2018-08-31 NOTE — Discharge Instructions (Addendum)
Continue to take Tylenol and Motrin for your pain.  Please take Motrin with food to prevent irritation of your stomach.  Return to the emergency department if you develop severe pain, chest pain or shortness of breath, lightheadedness or fainting, fever, or any other symptoms concerning to you.

## 2018-08-31 NOTE — ED Notes (Addendum)
Pt to the er for pain to the upper left arm on the lateral side at the deltoid. Pt has a hx of arthritis and bursitis. Pt denies pain moving anywhere else. Pt denies increased pain with movement.

## 2018-08-31 NOTE — ED Triage Notes (Signed)
Patient presents to the ED with left arm pain, mainly in her bicep but occasionally radiating into her shoulder.  Patient denies neck and chest pain.  Patient states pain began suddenly around 11am.  Patient states she has taken otc pain medication and it has not improved.  Arm is somewhat tender to the touch.  Patient is in no obvious distress at this time.  Patient denies history of blood clots.

## 2018-09-12 ENCOUNTER — Other Ambulatory Visit: Payer: Self-pay | Admitting: Family Medicine

## 2018-09-27 NOTE — Progress Notes (Signed)
Hysterectomy  Colposcopy 2018? Mammogram 08/2017- normal   Pt thinks she has a yeast infection externally on inner thighs

## 2018-09-28 ENCOUNTER — Ambulatory Visit (INDEPENDENT_AMBULATORY_CARE_PROVIDER_SITE_OTHER): Payer: Medicare HMO | Admitting: Obstetrics & Gynecology

## 2018-09-28 ENCOUNTER — Encounter: Payer: Self-pay | Admitting: Obstetrics & Gynecology

## 2018-09-28 VITALS — BP 142/82 | HR 105 | Ht 63.0 in | Wt 249.0 lb

## 2018-09-28 DIAGNOSIS — Z01419 Encounter for gynecological examination (general) (routine) without abnormal findings: Secondary | ICD-10-CM | POA: Diagnosis not present

## 2018-09-28 DIAGNOSIS — B372 Candidiasis of skin and nail: Secondary | ICD-10-CM

## 2018-09-28 DIAGNOSIS — Z1231 Encounter for screening mammogram for malignant neoplasm of breast: Secondary | ICD-10-CM

## 2018-09-28 MED ORDER — FLUCONAZOLE 150 MG PO TABS: 150.0000 mg | ORAL_TABLET | Freq: Once | ORAL | 1 refills | Status: AC

## 2018-09-28 MED ORDER — NYSTATIN 100000 UNIT/GM EX CREA: 1.0000 "application " | TOPICAL_CREAM | Freq: Two times a day (BID) | CUTANEOUS | 1 refills | Status: AC

## 2018-09-28 NOTE — Progress Notes (Signed)
GYNECOLOGY ANNUAL PREVENTATIVE CARE ENCOUNTER NOTE  Subjective:   Erika Burch is a 63 y.o. [phone] female here for a routine annual gynecologic exam. History of total hysterectomy 20 years ago for benign indications. Current complaints: skin yeast infection on labia and inner thighs.   Denies abnormal vaginal bleeding, discharge, pelvic pain or other gynecologic concerns.    Gynecologic History Patient's last menstrual period was 08/23/1998. Contraception: status post hysterectomy Last mammogram: 09/12/2017. Results were: normal  Obstetric History OB History  Gravida Para Term Preterm AB Living  4 2 2   2 2   SAB TAB Ectopic Multiple Live Births  2       2    # Outcome Date GA Lbr Len/2nd Weight Sex Delivery Anes PTL Lv  4 SAB           3 SAB           2 Term     F CS-Classical   LIV  1 Term     M Vag-Spont   LIV    Past Medical History:  Diagnosis Date  . Breast cyst    left  . Chronic fatigue   . Depression   . Fibromyalgia   . Hyperlipidemia   . Hypertension   . Menorrhagia   . 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 ABDOMINAL HYSTERECTOMY  2000   TAH, has ovaries  . CESAREAN SECTION    . TONSILLECTOMY    . TOTAL HIP ARTHROPLASTY Right 2010    Current Outpatient Medications on File Prior to Visit  Medication Sig Dispense Refill  . aspirin 81 MG chewable tablet Chew by mouth daily.    Marland Kitchen atorvastatin (LIPITOR) 20 MG tablet Take 1 tablet (20 mg total) by mouth daily. NEEDS OFFICE VISIT/LABS FOR REFILLS 30 tablet 0  . buPROPion (WELLBUTRIN) 75 MG tablet Take 75 mg by mouth daily.    . clonazePAM (KLONOPIN) 0.5 MG tablet 1/2 tablet 3 times daily 135 tablet 1  . DULoxetine (CYMBALTA) 60 MG capsule Take 60 mg by mouth daily.    . ergocalciferol (VITAMIN D2) 50000 UNITS capsule Take 50,000 Units by mouth every 30 (thirty) days.    . fluticasone (FLONASE) 50 MCG/ACT nasal spray Place into both nostrils daily.    .  folic acid (FOLVITE) 664 MCG tablet Take 400 mcg by mouth daily.    Marland Kitchen loratadine (CLARITIN) 10 MG tablet Take 10 mg by mouth daily.    Marland Kitchen LOSARTAN POTASSIUM PO Take 40 mg by mouth daily.    . Magnesium 250 MG TABS Take 250 mg by mouth 2 (two) times daily.    . valACYclovir (VALTREX) 500 MG tablet TAKE 1 TABLET BY MOUTH AS NEEDED 30 tablet 0  . amLODipine (NORVASC) 5 MG tablet Take 5 mg by mouth daily.    . cephALEXin (KEFLEX) 500 MG capsule Take 1 capsule (500 mg total) by mouth 3 (three) times daily. (Patient not taking: Reported on 09/07/2017) 30 capsule 0  . hydrochlorothiazide (HYDRODIURIL) 25 MG tablet Take 25 mg by mouth daily.    Marland Kitchen HYDROcodone-acetaminophen (VICODIN) 5-500 MG per tablet Take 1 tablet by mouth every 6 (six) hours as needed.    . metroNIDAZOLE (FLAGYL) 500 MG tablet Take 1 tablet (500 mg total) by mouth 2 (two) times daily. (Patient not taking: Reported on 09/28/2018) 14 tablet 0  . metroNIDAZOLE (METROGEL VAGINAL) 0.75 % vaginal gel Place 1 Applicatorful  vaginally 2 (two) times daily. (Patient not taking: Reported on 09/28/2018) 70 g 0  . nystatin-triamcinolone ointment (MYCOLOG) Apply 1 application topically 2 (two) times daily. (Patient not taking: Reported on 09/28/2018) 30 g 0   No current facility-administered medications on file prior to visit.     Allergies  Allergen Reactions  . Aspirin Nausea And Vomiting  . Codeine   . Losartan Cough  . Nickel Itching  . Other     opiates  . Sulfa Antibiotics Itching  . Tramadol Other (See Comments)    hallucinations    Social History:  reports that she has been smoking cigarettes. She has a 13.00 pack-year smoking history. She has never used smokeless tobacco. She reports current alcohol use of about 30.0 standard drinks of alcohol per week. She reports that she does not use drugs.  Family History  Problem Relation Age of Onset  . Diabetes Mother   . Heart attack Mother   . Mesothelioma Father   . Heart attack Father   .  Hypertension Sister   . Diabetes Sister   . Heart attack Sister   . Diabetes Brother   . Heart attack Brother   . Diabetes Maternal Grandmother     The following portions of the patient's history were reviewed and updated as appropriate: allergies, current medications, past family history, past medical history, past social history, past surgical history and problem list.  Review of Systems Pertinent items noted in HPI and remainder of comprehensive ROS otherwise negative.   Objective:  BP (!) 142/82   Pulse (!) 105   Ht 5\' 3"  (1.6 m)   Wt 249 lb (112.9 kg)   LMP 08/23/1998   BMI 44.11 kg/m  CONSTITUTIONAL: Well-developed, well-nourished female in no acute distress.  HENT:  Normocephalic, atraumatic, External right and left ear normal. Oropharynx is clear and moist EYES: Conjunctivae and EOM are normal. Pupils are equal, round, and reactive to light. No scleral icterus.  NECK: Normal range of motion, supple, no masses.  Normal thyroid.  NEUROLOGIC: Alert and oriented to person, place, and time. Normal reflexes, muscle tone coordination. No cranial nerve deficit noted. PSYCHIATRIC: Normal mood and affect. Normal behavior. Normal judgment and thought content. CARDIOVASCULAR: Normal heart rate noted, regular rhythm RESPIRATORY: Clear to auscultation bilaterally. Effort and breath sounds normal, no problems with respiration noted. BREASTS: Symmetric in size. No masses, skin changes, nipple drainage, or lymphadenopathy. Known pigmentation change (4 cm macular lesion underneath right breast, already evaluated by Dermatology and known to be a benign skin condition, gets in other parts of body) ABDOMEN: Soft, obese, normal bowel sounds, no distention appreciated.  No tenderness, rebound or guarding.  PELVIC: External genitalia with erythematous macular rash of labia and bilateral inner thighs; normal appearing vaginal mucosa and cuff.  Normal appearing discharge.  MUSCULOSKELETAL: Normal range  of motion. No tenderness.  No cyanosis, clubbing, or edema.  2+ distal pulses.   Assessment and Plan:  1. Skin yeast infection Medications prescribed for rash - nystatin cream (MYCOSTATIN); Apply 1 application topically 2 (two) times daily.  Dispense: 30 g; Refill: 1 - fluconazole (DIFLUCAN) 150 MG tablet; Take 1 tablet (150 mg total) by mouth once for 1 dose. Can take additional dose three days later if symptoms persist  Dispense: 1 tablet; Refill: 1  2. Encounter for screening mammogram for breast cancer Mammogram scheduled - MM 3D SCREEN BREAST BILATERAL; Future  3. Well woman exam with routine gynecological exam Will follow up results of pap smear  and manage accordingly. Routine preventative health maintenance measures emphasized. Please refer to After Visit Summary for other counseling recommendations.    Verita Schneiders, MD, [facility] [employer] [facility], [place]

## 2018-11-07 ENCOUNTER — Other Ambulatory Visit: Payer: Self-pay | Admitting: Family Medicine

## 2018-11-07 ENCOUNTER — Ambulatory Visit
Admission: RE | Admit: 2018-11-07 | Discharge: 2018-11-07 | Disposition: A | Discharge status: Home / Self Care | Payer: Medicare HMO | Source: Ambulatory Visit | Attending: Family Medicine | Admitting: Family Medicine

## 2018-11-07 ENCOUNTER — Other Ambulatory Visit: Payer: Self-pay

## 2018-11-07 DIAGNOSIS — R600 Localized edema: Secondary | ICD-10-CM | POA: Diagnosis present

## 2019-02-27 IMAGING — US US EXTREM  UP VENOUS*L*
1 series · 13 of 24 positions shown · non-contrast
Comparison: None.

CLINICAL DATA: Acute left arm pain



[Series 1: us extrem up venous*left* · 0.09mm/px · 13 of 31 slices shown]
[im 1/31]
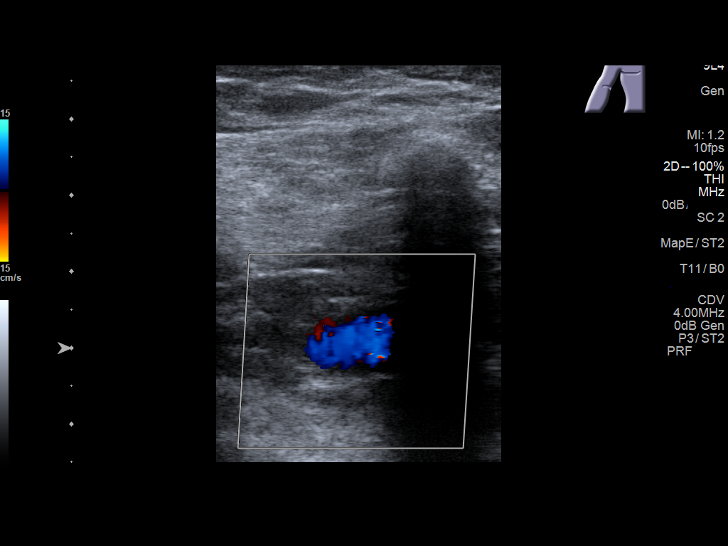
[im 3/31]
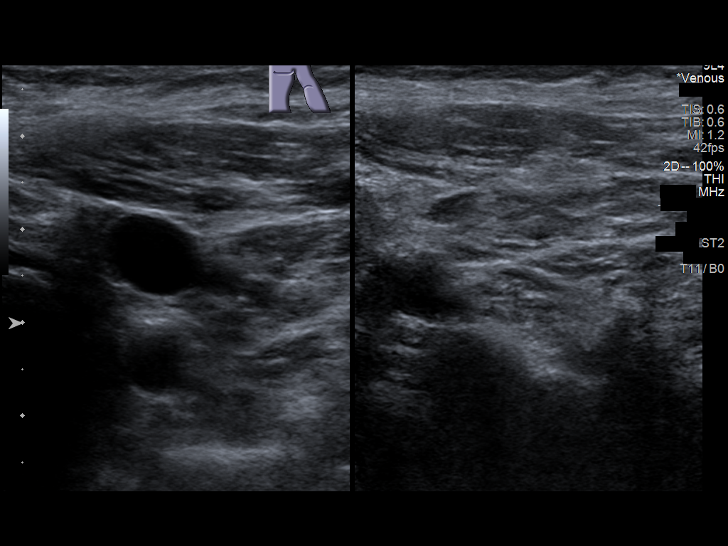
[im 6/31]
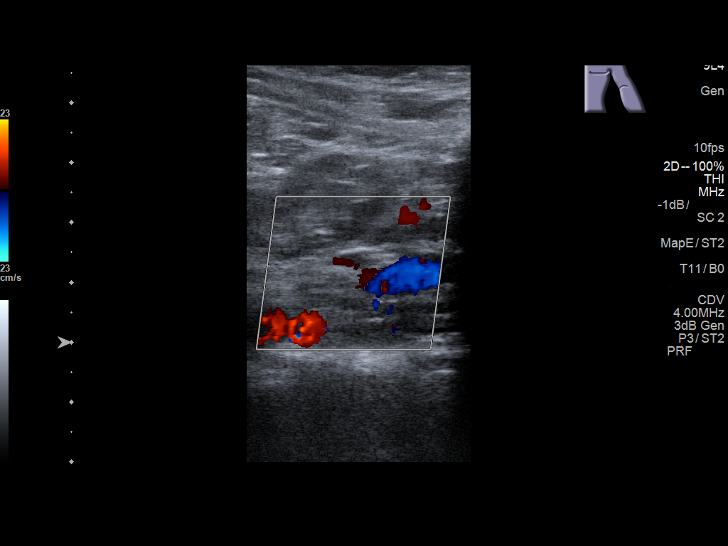
[im 8/31]
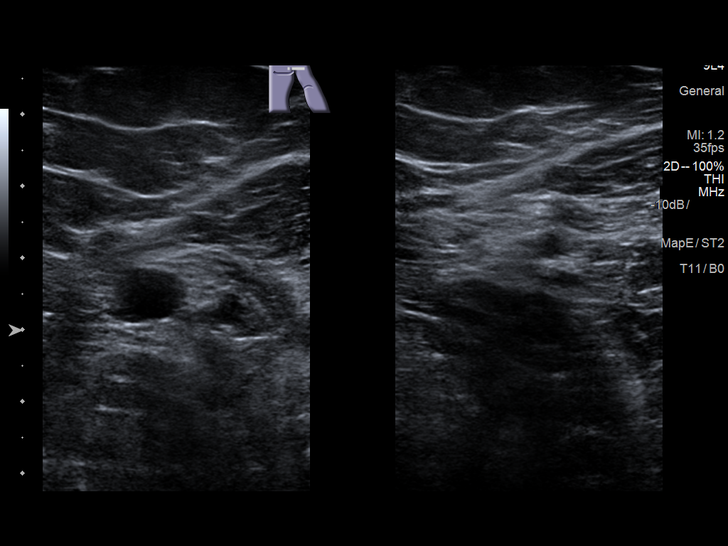
[im 11/31]
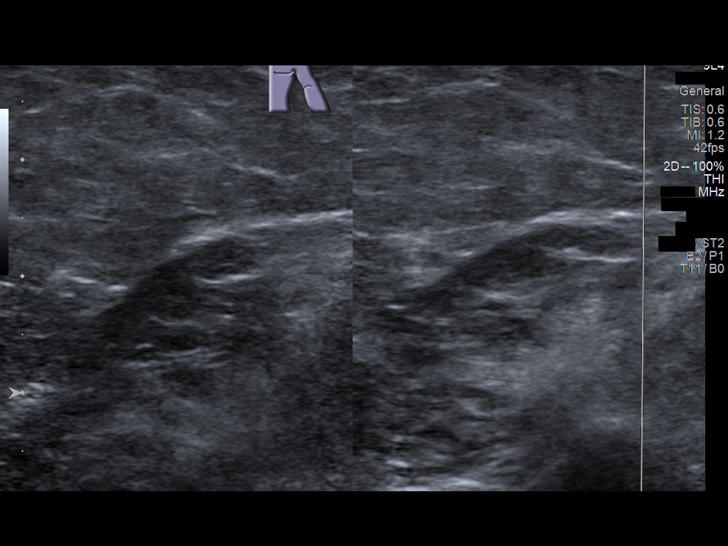
[im 14/31]
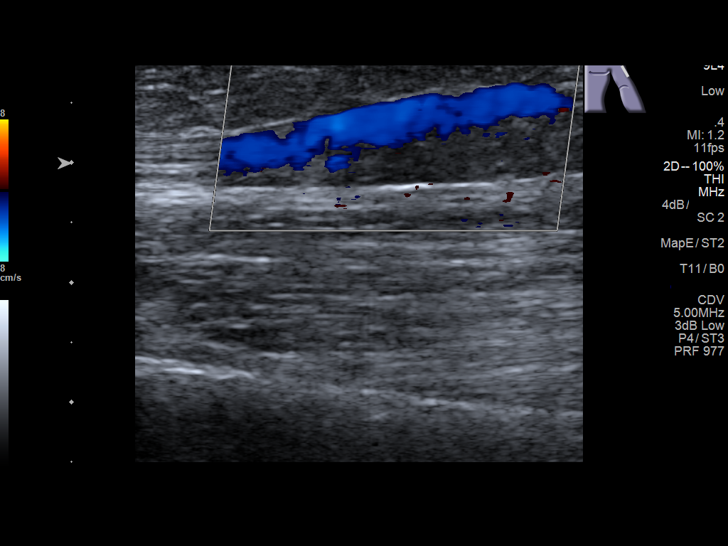
[im 16/31]
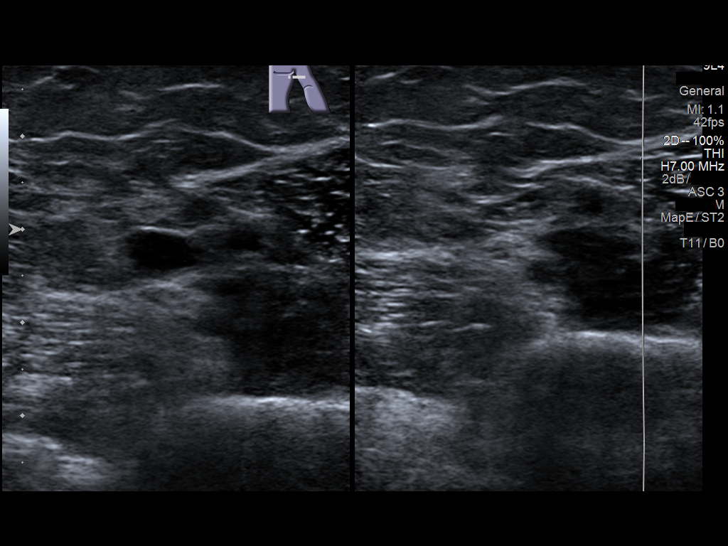
[im 17/31]
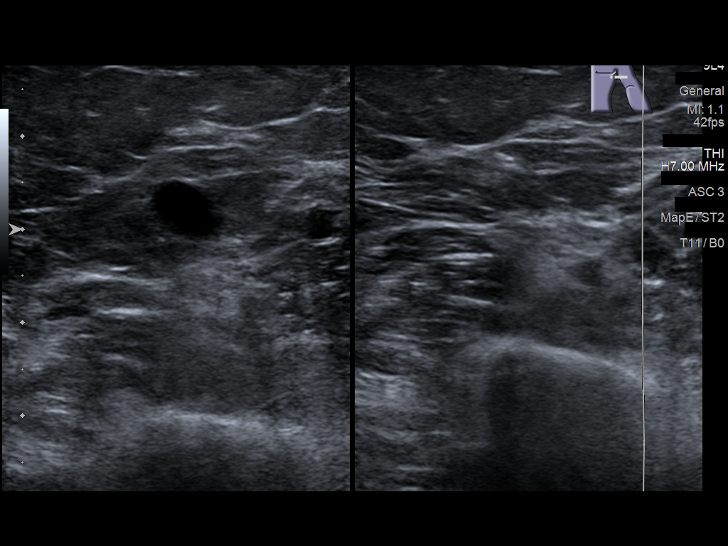
[im 20/31]
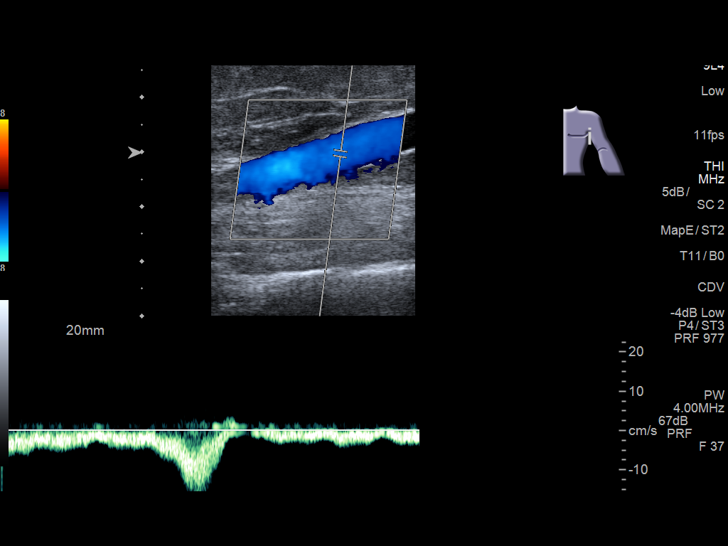
[im 23/31]
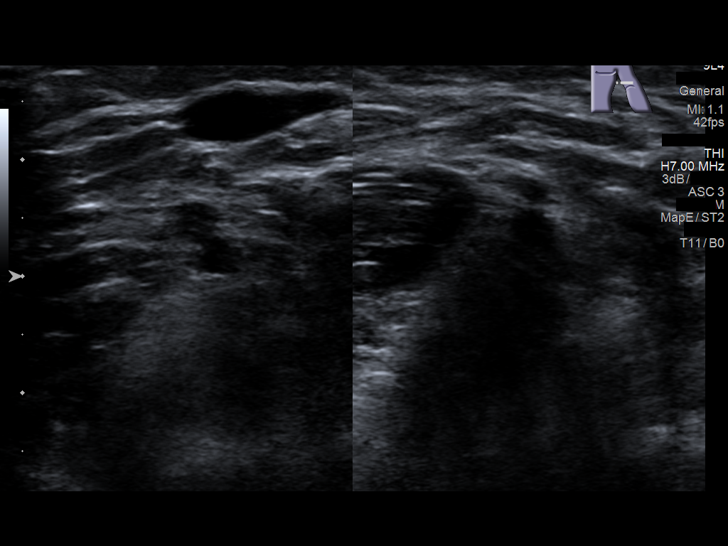
[im 25/31]
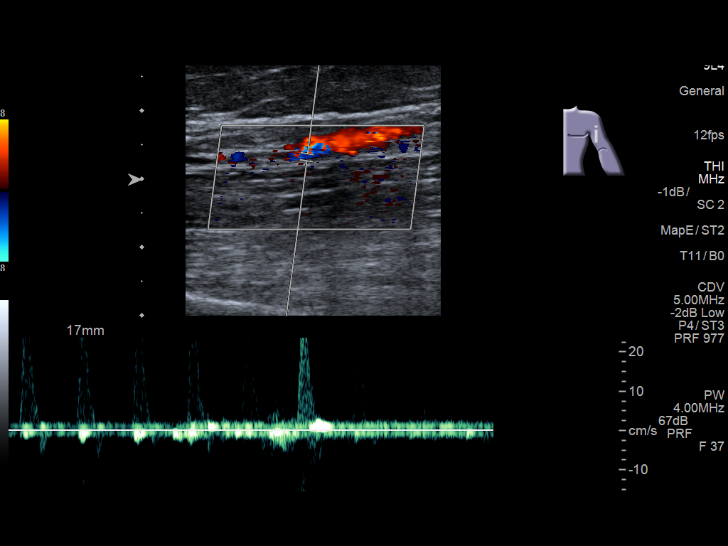
[im 28/31]
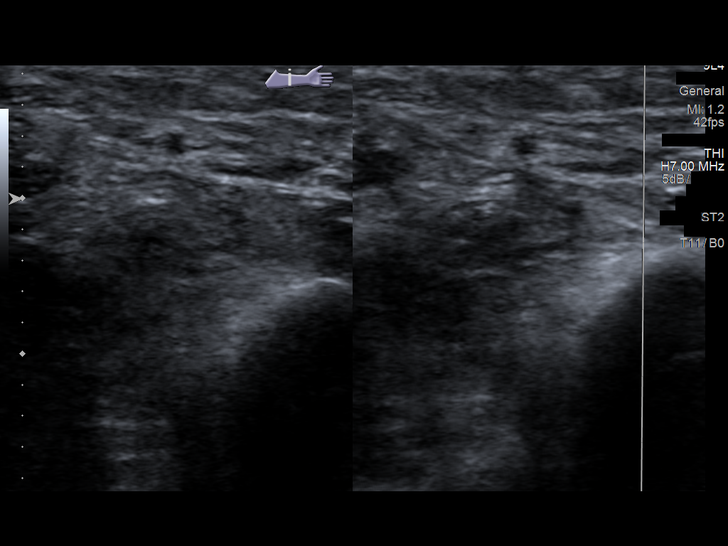
[im 31/31]
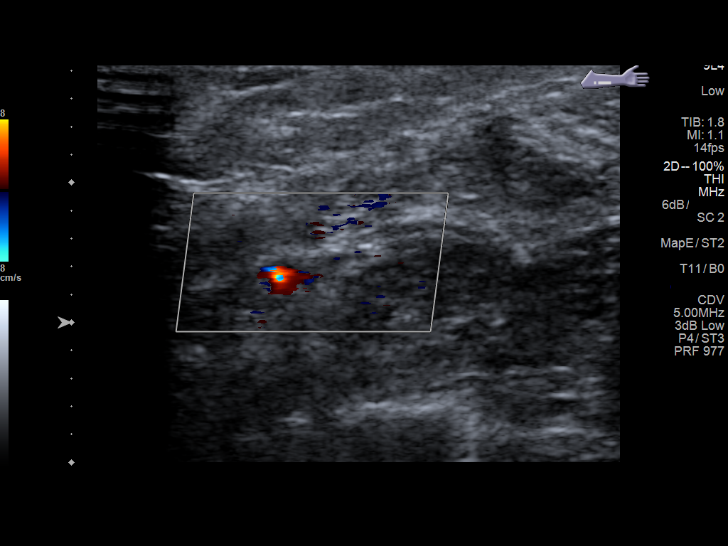

[13 of 24 positions shown; findings below may reference images not displayed]

FINDINGS: Contralateral Subclavian Vein: Respiratory phasicity is normal and
symmetric with the symptomatic side. No evidence of thrombus. Normal
compressibility.

Internal Jugular Vein: No evidence of thrombus. Normal
compressibility, respiratory phasicity and response to augmentation.

Subclavian Vein: No evidence of thrombus. Normal compressibility,
respiratory phasicity and response to augmentation.

Axillary Vein: No evidence of thrombus. Normal compressibility,
respiratory phasicity and response to augmentation.

Cephalic Vein: No evidence of thrombus. Normal compressibility,
respiratory phasicity and response to augmentation.

Basilic Vein: No evidence of thrombus. Normal compressibility,
respiratory phasicity and response to augmentation.

Brachial Veins: No evidence of thrombus. Normal compressibility,
respiratory phasicity and response to augmentation.

Radial Veins: No evidence of thrombus. Normal compressibility,
respiratory phasicity and response to augmentation.

Ulnar Veins: No evidence of thrombus. Normal compressibility,
respiratory phasicity and response to augmentation.

Venous Reflux:  None visualized.

Other Findings:  None visualized.
IMPRESSION: No evidence of DVT within the left upper extremity.

## 2019-04-24 ENCOUNTER — Ambulatory Visit
Admission: RE | Admit: 2019-04-24 | Discharge: 2019-04-24 | Disposition: A | Discharge status: Home / Self Care | Payer: Medicare HMO | Source: Ambulatory Visit | Attending: Obstetrics & Gynecology | Admitting: Obstetrics & Gynecology

## 2019-04-24 DIAGNOSIS — Z1231 Encounter for screening mammogram for malignant neoplasm of breast: Secondary | ICD-10-CM | POA: Insufficient documentation

## 2019-08-01 ENCOUNTER — Encounter: Payer: Self-pay | Admitting: Radiology

## 2020-03-15 ENCOUNTER — Emergency Department
Admission: EM | Admit: 2020-03-15 | Discharge: 2020-03-15 | Disposition: A | Discharge status: Home / Self Care | Payer: Medicare HMO | Attending: Emergency Medicine | Admitting: Emergency Medicine

## 2020-03-15 ENCOUNTER — Other Ambulatory Visit: Payer: Self-pay

## 2020-03-15 DIAGNOSIS — I10 Essential (primary) hypertension: Secondary | ICD-10-CM | POA: Insufficient documentation

## 2020-03-15 DIAGNOSIS — W274XXA Contact with kitchen utensil, initial encounter: Secondary | ICD-10-CM | POA: Insufficient documentation

## 2020-03-15 DIAGNOSIS — S61209A Unspecified open wound of unspecified finger without damage to nail, initial encounter: Secondary | ICD-10-CM

## 2020-03-15 DIAGNOSIS — Z23 Encounter for immunization: Secondary | ICD-10-CM | POA: Diagnosis not present

## 2020-03-15 DIAGNOSIS — E119 Type 2 diabetes mellitus without complications: Secondary | ICD-10-CM | POA: Insufficient documentation

## 2020-03-15 DIAGNOSIS — Z79899 Other long term (current) drug therapy: Secondary | ICD-10-CM | POA: Diagnosis not present

## 2020-03-15 DIAGNOSIS — Z7982 Long term (current) use of aspirin: Secondary | ICD-10-CM | POA: Insufficient documentation

## 2020-03-15 DIAGNOSIS — Z87891 Personal history of nicotine dependence: Secondary | ICD-10-CM | POA: Diagnosis not present

## 2020-03-15 DIAGNOSIS — Z96641 Presence of right artificial hip joint: Secondary | ICD-10-CM | POA: Diagnosis not present

## 2020-03-15 DIAGNOSIS — Y92009 Unspecified place in unspecified non-institutional (private) residence as the place of occurrence of the external cause: Secondary | ICD-10-CM | POA: Insufficient documentation

## 2020-03-15 DIAGNOSIS — Y999 Unspecified external cause status: Secondary | ICD-10-CM | POA: Diagnosis not present

## 2020-03-15 DIAGNOSIS — Y93G3 Activity, cooking and baking: Secondary | ICD-10-CM | POA: Insufficient documentation

## 2020-03-15 DIAGNOSIS — S61011A Laceration without foreign body of right thumb without damage to nail, initial encounter: Secondary | ICD-10-CM | POA: Diagnosis present

## 2020-03-15 MED ORDER — CEPHALEXIN 500 MG PO CAPS
500.0000 mg | ORAL_CAPSULE | Freq: Once | ORAL | Status: AC
  Administered 2020-03-15: 500 mg via ORAL
  Filled 2020-03-15: qty 1

## 2020-03-15 MED ORDER — SILVER NITRATE-POT NITRATE 75-25 % EX MISC
1.0000 | Freq: Once | CUTANEOUS | Status: AC
  Administered 2020-03-15: 1 via TOPICAL
  Filled 2020-03-15: qty 10

## 2020-03-15 MED ORDER — CEPHALEXIN 500 MG PO CAPS: 500.0000 mg | ORAL_CAPSULE | Freq: Three times a day (TID) | ORAL | 0 refills | Status: AC

## 2020-03-15 MED ORDER — TETANUS-DIPHTH-ACELL PERTUSSIS 5-2.5-18.5 LF-MCG/0.5 IM SUSP
0.5000 mL | Freq: Once | INTRAMUSCULAR | Status: AC
  Administered 2020-03-15: 0.5 mL via INTRAMUSCULAR
  Filled 2020-03-15: qty 0.5

## 2020-03-15 NOTE — ED Notes (Signed)
Pt states was slicing green tomatoes with a mandolin PTA, pt states sliced R thumb on mandolin, laceration to pad of R thumb at this time. Pt with noted large open wound to pad of thumb, bleeding continues upon assessment. Pt states unknown when last tetanus shot is.

## 2020-03-15 NOTE — Discharge Instructions (Addendum)
You were seen today for a skin avulsion of your right thumb.  This was unable to be repaired but we did controlled bleeding.  You received a tetanus injection and a dose of antibiotics to prevent infection.  I am sending you home with a prescription for antibiotics to take 3 times daily for the next 7 days.  Please follow-up with your PCP if you notice increased pain, redness, swelling, drainage or bleeding.

## 2020-03-15 NOTE — ED Provider Notes (Signed)
[facility] Emergency Department Provider Note ____________________________________________  Time seen: 1900  I have reviewed the triage vital signs and the nursing notes.  HISTORY  Chief Complaint  Laceration   HPI Erika Burch is a 64 y.o. female presents to the ER today with complaint of laceration to her right thumb.  She reports approximately 2 hours ago she was slicing tomatoes with a mandolin when it slipped and grazed her right thumb.  She reports the skin is completely off of thumb, which she did bring with her in case we could reattach it.  She has been having trouble controlling the bleeding, despite applying pressure.  She does take a 81 mg aspirin daily.  Her last tetanus was 08/2010.  Past Medical History:  Diagnosis Date  . Breast cyst    left  . Chronic fatigue   . Depression   . Fibromyalgia   . Hyperlipidemia   . Hypertension   . Menorrhagia   . Migraines    without aura    Patient Active Problem List   Diagnosis Date Noted  . Bacterial vaginitis 09/09/2017  . Vaginal irritation 09/07/2017  . Encounter for well woman exam with routine gynecological exam 09/07/2017  . Alcohol abuse 11/14/2013    Class: History of  . HYPERLIPIDEMIA 12/16/2007  . DEPRESSION/ANXIETY 12/16/2007  . DEGENERATIVE DISC DISEASE 12/16/2007  . FIBROMYALGIA 12/16/2007  . DIABETES MELLITUS, BORDERLINE 12/16/2007    Past Surgical History:  Procedure Laterality Date  . ABDOMINAL HYSTERECTOMY  2000   TAH, has ovaries  . CESAREAN SECTION    . TONSILLECTOMY    . TOTAL HIP ARTHROPLASTY Right 2010    Prior to Admission medications   Medication Sig Start Date End Date Taking? Authorizing Provider  amLODipine (NORVASC) 5 MG tablet Take 5 mg by mouth daily.    [provider]  aspirin 81 MG chewable tablet Chew by mouth daily.    [provider]  atorvastatin (LIPITOR) 20 MG tablet Take 1 tablet (20 mg total) by mouth daily. NEEDS OFFICE  VISIT/LABS FOR REFILLS 02/15/12   Rise Mu, PA-C  buPROPion [facility]) 75 MG tablet Take 75 mg by mouth daily.    [provider]  cephALEXin (KEFLEX) 500 MG capsule Take 1 capsule (500 mg total) by mouth 3 (three) times daily for 10 days. 03/15/20 03/25/20  Jearld Fenton, NP  clonazePAM (KLONOPIN) 0.5 MG tablet 1/2 tablet 3 times daily 11/04/11   Copland, Gay Filler, MD  DULoxetine (CYMBALTA) 60 MG capsule Take 60 mg by mouth daily.    [provider]  ergocalciferol (VITAMIN D2) 50000 UNITS capsule Take 50,000 Units by mouth every 30 (thirty) days.    [provider]  fluticasone (FLONASE) 50 MCG/ACT nasal spray Place into both nostrils daily.    [provider]  folic acid (FOLVITE) 277 MCG tablet Take 400 mcg by mouth daily.    [provider]  hydrochlorothiazide (HYDRODIURIL) 25 MG tablet Take 25 mg by mouth daily.    [provider]  HYDROcodone-acetaminophen (VICODIN) 5-500 MG per tablet Take 1 tablet by mouth every 6 (six) hours as needed.    [provider]  loratadine (CLARITIN) 10 MG tablet Take 10 mg by mouth daily.    [provider]  LOSARTAN POTASSIUM PO Take 40 mg by mouth daily.    [provider]  Magnesium 250 MG TABS Take 250 mg by mouth 2 (two) times daily.    [provider]  metroNIDAZOLE (FLAGYL) 500 MG tablet Take 1 tablet (500 mg total) by mouth 2 (two) times daily. Patient not taking: Reported on 09/28/2018 09/09/17   Laury Deep, CNM  metroNIDAZOLE (METROGEL VAGINAL) 0.75 % vaginal gel Place 1 Applicatorful vaginally 2 (two) times daily. Patient not taking: Reported on 09/28/2018 09/14/17   Caren Macadam, MD  nystatin cream (MYCOSTATIN) Apply 1 application topically 2 (two) times daily. 09/28/18   Anyanwu, Sallyanne Havers, MD  nystatin-triamcinolone ointment (MYCOLOG) Apply 1 application topically 2 (two) times daily. Patient not taking: Reported on 09/28/2018 09/07/17   Laury Deep, CNM  valACYclovir (VALTREX) 500 MG tablet TAKE 1 TABLET BY MOUTH AS NEEDED 01/03/15   Dino Borntreger Eck, CNM    Allergies Codeine, Losartan, Nickel, Other, Sulfa antibiotics, and Tramadol  Family History  Problem Relation Age of Onset  . Diabetes Mother   . Heart attack Mother   . Mesothelioma Father   . Heart attack Father   . Hypertension Sister   . Diabetes Sister   . Heart attack Sister   . Diabetes Brother   . Heart attack Brother   . Diabetes Maternal Grandmother     Social History Social History   Tobacco Use  . Smoking status: Former Smoker    Packs/day: 1.00    Years: 13.00    Pack years: 13.00    Types: Cigarettes  . Smokeless tobacco: Never Used  . Tobacco comment: vape  Substance Use Topics  . Alcohol use: Yes    Alcohol/week: 30.0 standard drinks    Types: 30 Standard drinks or equivalent per week  . Drug use: No    Review of Systems  Constitutional: Negative for fever, chills or body aches. Cardiovascular: Negative for chest pain or chest tightness. Respiratory: Negative for cough or shortness of breath. Skin: Positive for laceration of right thumb.  Neurological: Negative for focal weakness, tingling or numbness. ____________________________________________  PHYSICAL EXAM:  VITAL SIGNS: ED Triage Vitals  Enc Vitals Group     BP 03/15/20 1749 (!) 179/79     Pulse Rate 03/15/20 1749 99     Resp 03/15/20 1749 18     Temp 03/15/20 1749 98.1 F (36.7 C)     Temp Source 03/15/20 1749 Oral     SpO2 03/15/20 1749 97 %     Weight 03/15/20 1750 (!) 238 lb (108 kg)     Height 03/15/20 1750 5\' 3"  (1.6 m)     Head Circumference --      Peak Flow --      Pain Score 03/15/20 1750 4     Pain Loc --      Pain Edu? --      Excl. in [place]? --     Constitutional: Alert and oriented. Well appearing and in no distress. Head: Normocephalic and atraumatic. Cardiovascular: Normal rate, regular rhythm. Right radial pulse 2+. Respiratory: Normal  respiratory effort. No wheezes/rales/rhonchi. Musculoskeletal: Normal flexion, extension and rotation of the right wrist. Normal flexion and extension of the right thumb. No joint swelling noted.  Neurologic:  Normal gait without ataxia. Normal speech and language. No gross focal neurologic deficits are appreciated. Skin:  1.5 cm triangle shaped avulsion noted of pad of right thumb. Psychiatric: Mood and affect are normal. Patient exhibits appropriate insight and judgment.  ____________________________________________    INITIAL IMPRESSION / ASSESSMENT AND PLAN / ED COURSE  Skin Avulsion, Right Thumb:  Bleeding stopped with Surgicel, Silver nitrate x 1 Wound cleanses by this provider, area  covered with Vaseline gauze, Telfa and gauze Keflex 500 mg PO x 1 Tdap given RX for Keflex 500 mg PO TID x 7 days    ____________________________________________  FINAL CLINICAL IMPRESSION(S) / ED DIAGNOSES  Final diagnoses:  Avulsion of skin of finger, initial encounter      Jearld Fenton, NP 03/15/20 1946    Nance Pear, MD 03/15/20 2000

## 2020-03-15 NOTE — ED Triage Notes (Signed)
Pt was slicing tomatoes and cut R thumb. A&O, ambulatory. Tetanus within last 5 years. No blood thinners.

## 2021-04-14 ENCOUNTER — Other Ambulatory Visit: Payer: Self-pay

## 2021-04-14 DIAGNOSIS — Z1231 Encounter for screening mammogram for malignant neoplasm of breast: Secondary | ICD-10-CM

## 2021-04-28 ENCOUNTER — Other Ambulatory Visit: Payer: Self-pay

## 2021-04-28 ENCOUNTER — Ambulatory Visit
Admission: RE | Admit: 2021-04-28 | Discharge: 2021-04-28 | Disposition: A | Discharge status: Home / Self Care | Payer: Medicare HMO | Source: Ambulatory Visit

## 2021-04-28 DIAGNOSIS — Z1231 Encounter for screening mammogram for malignant neoplasm of breast: Secondary | ICD-10-CM | POA: Diagnosis present

## 2021-05-01 IMAGING — MG MM DIGITAL SCREENING BILAT W/ TOMO W/ CAD
6 of 10 series · 6 of 30 positions shown · non-contrast
Comparison: Previous exam(s).

CLINICAL DATA: Screening.

EXAM:
DIGITAL SCREENING BILATERAL MAMMOGRAM WITH TOMO AND CAD

[L MLO synth-2D (1 of 2)]
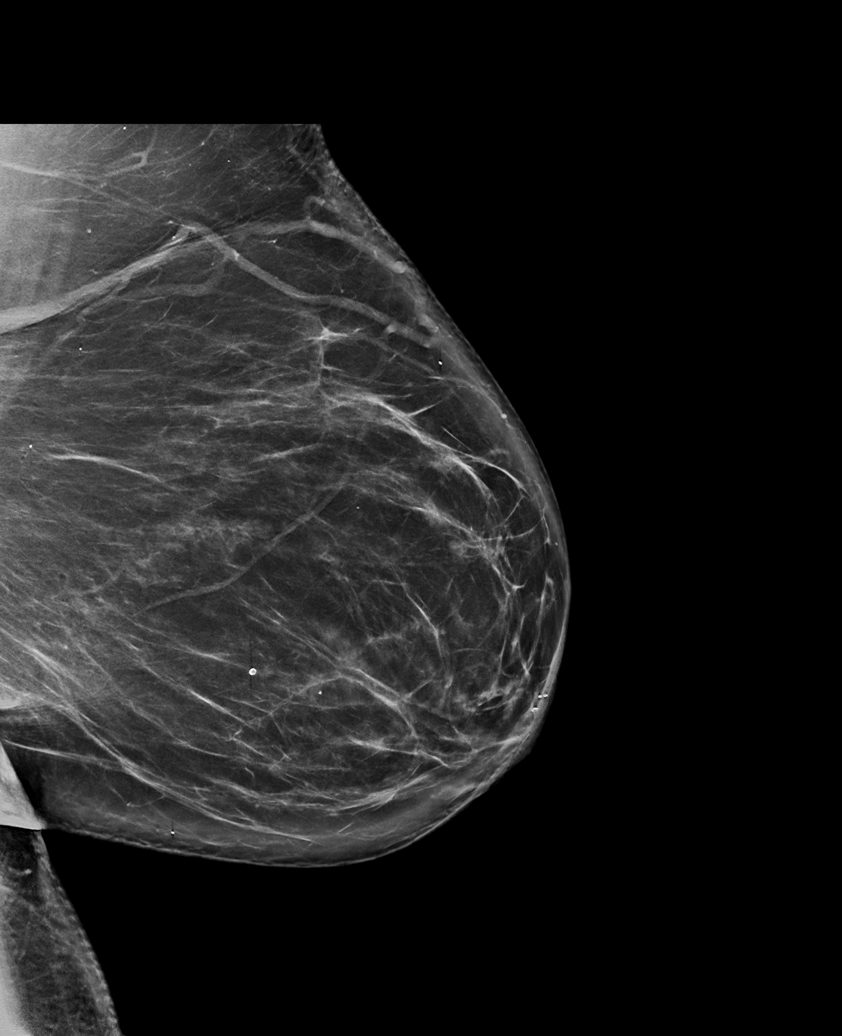

[L CC synth-2D]
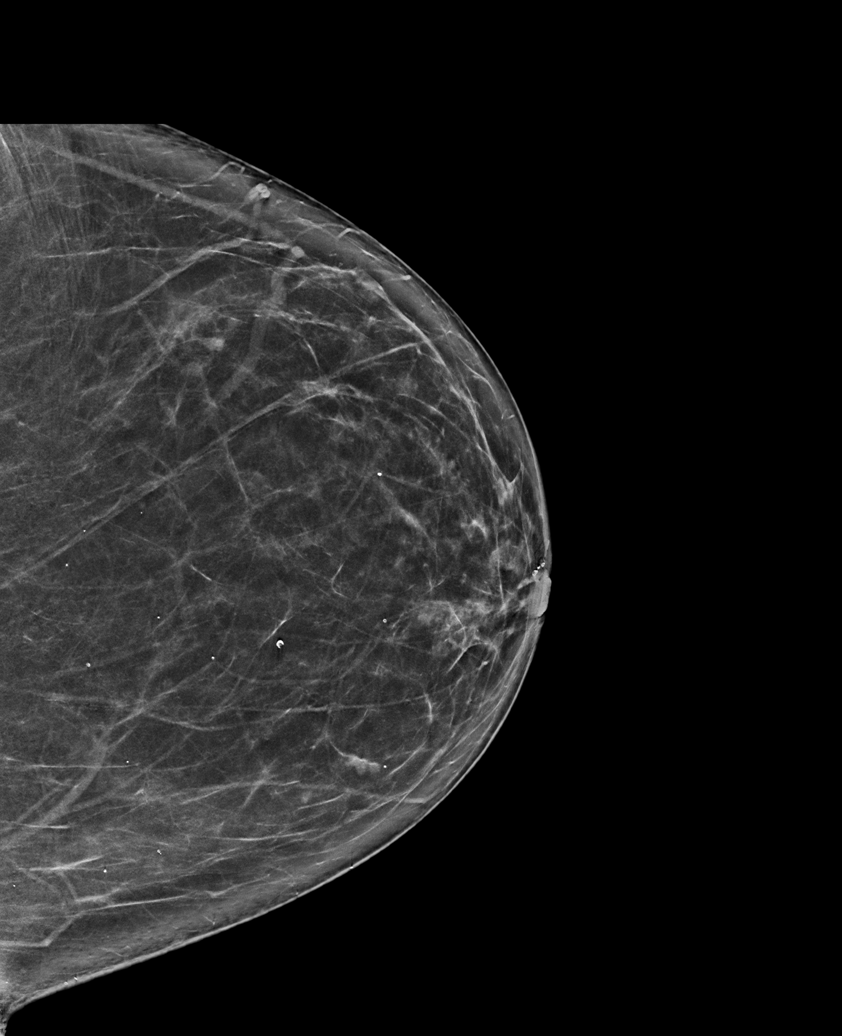

[R CC synth-2D]
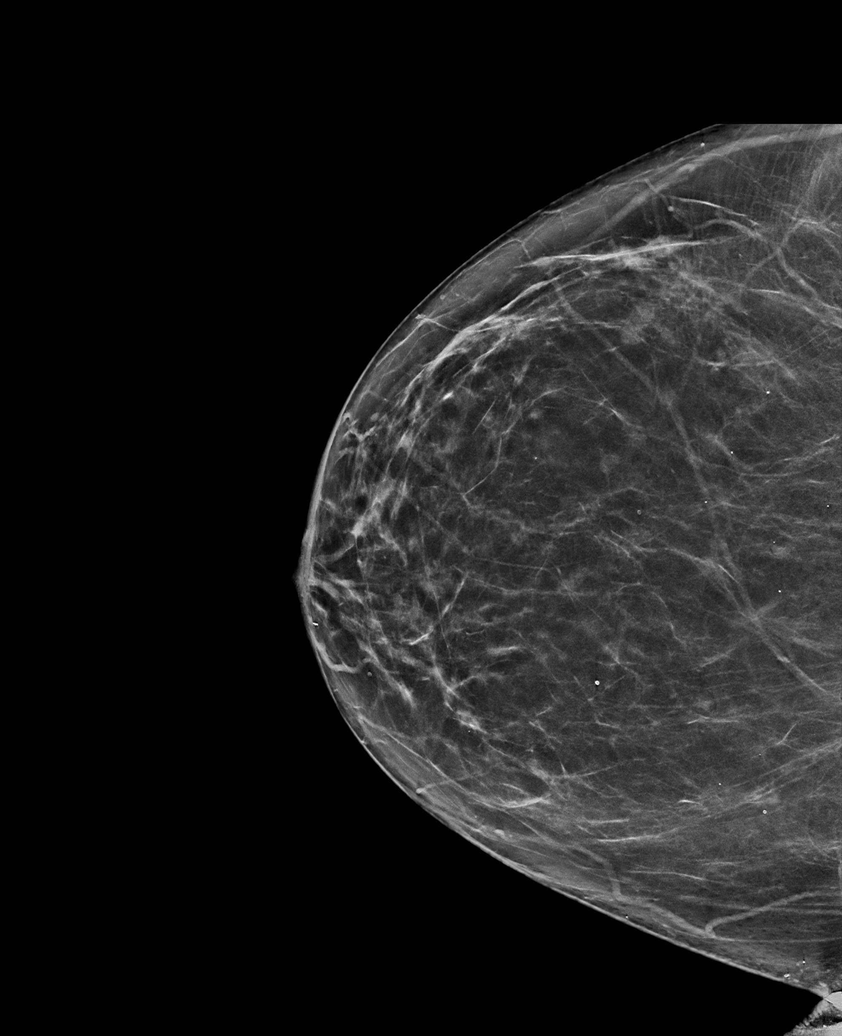

[R MLO synth-2D]
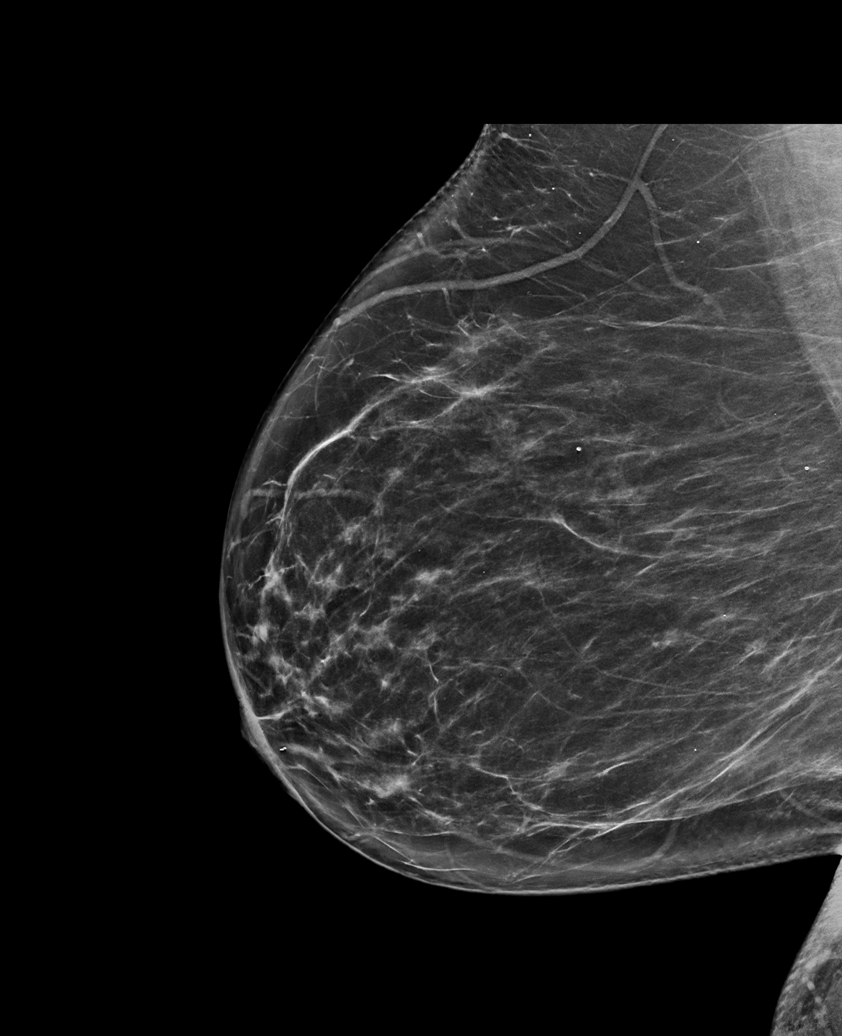

[L MLO synth-2D (2 of 2)]
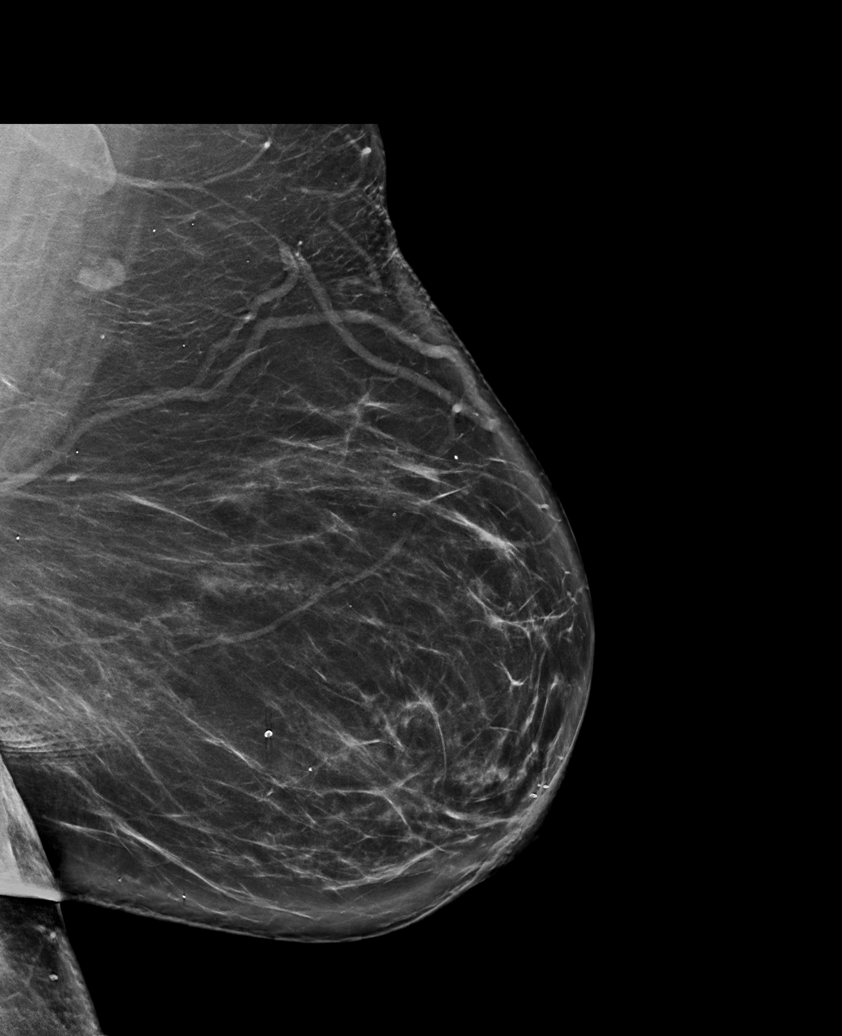

[L MLO tomo · tomo slice 47/94.0]
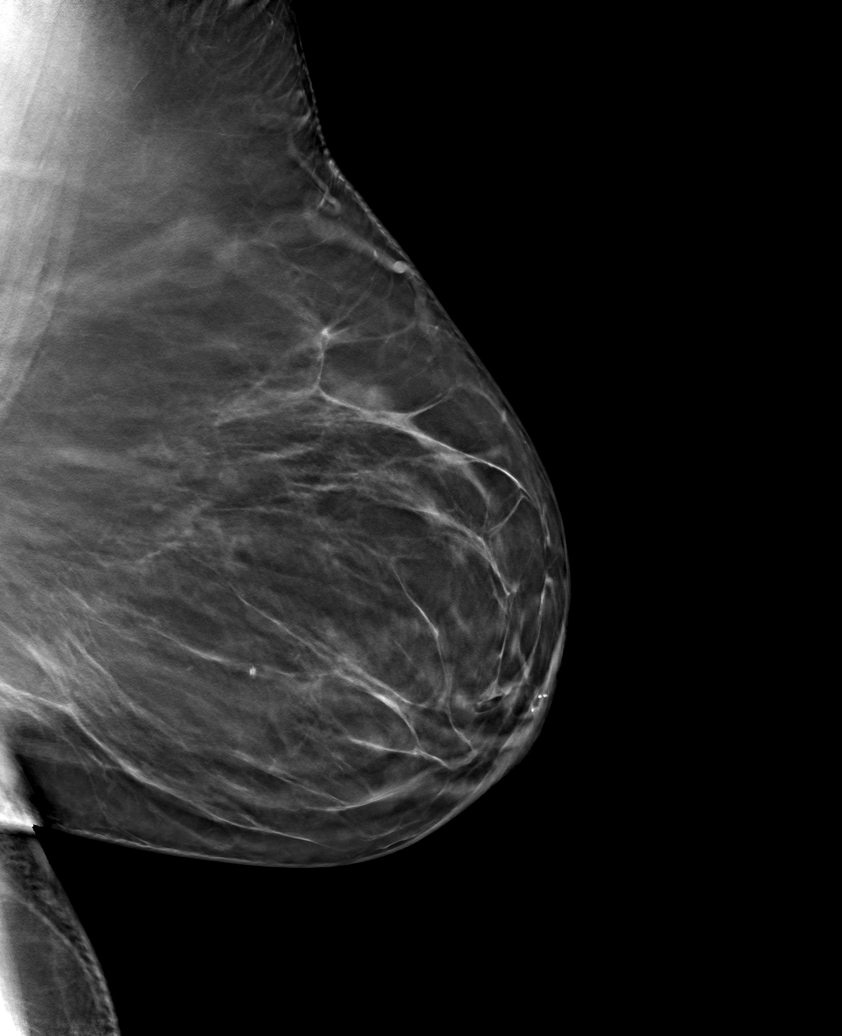

[6 of 30 positions shown; findings below may reference images not displayed]

ACR Breast Density Category b: There are scattered areas of
fibroglandular density.
FINDINGS: There are no findings suspicious for malignancy. Images were
processed with CAD.
IMPRESSION: No mammographic evidence of malignancy. A result letter of this
screening mammogram will be mailed directly to the patient.

RECOMMENDATION:
Screening mammogram in one year. (Code:[id number])

BI-RADS CATEGORY  1: Negative.

## 2021-12-23 ENCOUNTER — Ambulatory Visit
Admission: RE | Admit: 2021-12-23 | Discharge: 2021-12-23 | Disposition: A | Discharge status: Home / Self Care | Payer: Medicare HMO | Source: Ambulatory Visit | Attending: Family Medicine | Admitting: Family Medicine

## 2021-12-23 ENCOUNTER — Other Ambulatory Visit: Payer: Self-pay | Admitting: Family Medicine

## 2021-12-23 ENCOUNTER — Ambulatory Visit
Admission: RE | Admit: 2021-12-23 | Discharge: 2021-12-23 | Disposition: A | Discharge status: Home / Self Care | Payer: Medicare HMO | Attending: Family Medicine | Admitting: Family Medicine

## 2021-12-23 DIAGNOSIS — R52 Pain, unspecified: Secondary | ICD-10-CM | POA: Diagnosis not present

## 2022-03-23 ENCOUNTER — Encounter
Admission: RE | Disposition: A | Discharge status: Home / Self Care | Payer: Self-pay | Source: Home / Self Care | Attending: Gastroenterology

## 2022-03-23 ENCOUNTER — Ambulatory Visit: Payer: Medicare HMO | Admitting: General Practice

## 2022-03-23 ENCOUNTER — Encounter: Payer: Self-pay | Admitting: *Deleted

## 2022-03-23 ENCOUNTER — Ambulatory Visit
Admission: RE | Admit: 2022-03-23 | Discharge: 2022-03-23 | Disposition: A | Discharge status: Home / Self Care | Payer: Medicare HMO | Attending: Gastroenterology | Admitting: Gastroenterology

## 2022-03-23 DIAGNOSIS — E785 Hyperlipidemia, unspecified: Secondary | ICD-10-CM | POA: Insufficient documentation

## 2022-03-23 DIAGNOSIS — F32A Depression, unspecified: Secondary | ICD-10-CM | POA: Insufficient documentation

## 2022-03-23 DIAGNOSIS — D123 Benign neoplasm of transverse colon: Secondary | ICD-10-CM | POA: Insufficient documentation

## 2022-03-23 DIAGNOSIS — Z1211 Encounter for screening for malignant neoplasm of colon: Secondary | ICD-10-CM | POA: Diagnosis present

## 2022-03-23 DIAGNOSIS — Z79899 Other long term (current) drug therapy: Secondary | ICD-10-CM | POA: Insufficient documentation

## 2022-03-23 DIAGNOSIS — K640 First degree hemorrhoids: Secondary | ICD-10-CM | POA: Insufficient documentation

## 2022-03-23 DIAGNOSIS — I10 Essential (primary) hypertension: Secondary | ICD-10-CM | POA: Insufficient documentation

## 2022-03-23 DIAGNOSIS — Z87891 Personal history of nicotine dependence: Secondary | ICD-10-CM | POA: Diagnosis not present

## 2022-03-23 DIAGNOSIS — G4730 Sleep apnea, unspecified: Secondary | ICD-10-CM | POA: Diagnosis not present

## 2022-03-23 DIAGNOSIS — M797 Fibromyalgia: Secondary | ICD-10-CM | POA: Diagnosis not present

## 2022-03-23 DIAGNOSIS — Z86010 Personal history of colonic polyps: Secondary | ICD-10-CM | POA: Insufficient documentation

## 2022-03-23 DIAGNOSIS — D127 Benign neoplasm of rectosigmoid junction: Secondary | ICD-10-CM | POA: Diagnosis not present

## 2022-03-23 DIAGNOSIS — R7303 Prediabetes: Secondary | ICD-10-CM | POA: Diagnosis not present

## 2022-03-23 HISTORY — PX: COLONOSCOPY WITH PROPOFOL: SHX5780

## 2022-03-23 HISTORY — DX: Prediabetes: R73.03

## 2022-03-23 HISTORY — DX: Sleep apnea, unspecified: G47.30

## 2022-03-23 SURGERY — COLONOSCOPY WITH PROPOFOL
Anesthesia: General

## 2022-03-23 MED ORDER — LACTATED RINGERS IV SOLN: INTRAVENOUS | Status: DC | PRN

## 2022-03-23 MED ORDER — SODIUM CHLORIDE 0.9 % IV SOLN: INTRAVENOUS | Status: DC

## 2022-03-23 MED ORDER — FENTANYL CITRATE (PF) 100 MCG/2ML IJ SOLN
INTRAMUSCULAR | Status: AC
  Filled 2022-03-23: qty 2

## 2022-03-23 MED ORDER — SUCCINYLCHOLINE CHLORIDE 200 MG/10ML IV SOSY
PREFILLED_SYRINGE | INTRAVENOUS | Status: DC | PRN
  Administered 2022-03-23: 100 mg via INTRAVENOUS

## 2022-03-23 MED ORDER — LIDOCAINE HCL (CARDIAC) PF 100 MG/5ML IV SOSY
PREFILLED_SYRINGE | INTRAVENOUS | Status: DC | PRN
  Administered 2022-03-23: 100 mg via INTRAVENOUS

## 2022-03-23 MED ORDER — FENTANYL CITRATE (PF) 100 MCG/2ML IJ SOLN
INTRAMUSCULAR | Status: DC | PRN
  Administered 2022-03-23: 50 ug via INTRAVENOUS

## 2022-03-23 MED ORDER — DEXAMETHASONE SODIUM PHOSPHATE 10 MG/ML IJ SOLN
INTRAMUSCULAR | Status: AC
  Filled 2022-03-23: qty 1

## 2022-03-23 MED ORDER — ONDANSETRON HCL 4 MG/2ML IJ SOLN
INTRAMUSCULAR | Status: AC
  Filled 2022-03-23: qty 2

## 2022-03-23 MED ORDER — PROPOFOL 10 MG/ML IV BOLUS
INTRAVENOUS | Status: DC | PRN
  Administered 2022-03-23: 50 mg via INTRAVENOUS
  Administered 2022-03-23: 150 mg via INTRAVENOUS

## 2022-03-23 MED ORDER — ESMOLOL HCL 100 MG/10ML IV SOLN
INTRAVENOUS | Status: DC | PRN
  Administered 2022-03-23: 20 mg via INTRAVENOUS

## 2022-03-23 MED ORDER — ESMOLOL HCL 100 MG/10ML IV SOLN
INTRAVENOUS | Status: AC
  Filled 2022-03-23: qty 10

## 2022-03-23 MED ORDER — ROCURONIUM BROMIDE 100 MG/10ML IV SOLN
INTRAVENOUS | Status: DC | PRN
  Administered 2022-03-23: 20 mg via INTRAVENOUS

## 2022-03-23 MED ORDER — SUCCINYLCHOLINE CHLORIDE 200 MG/10ML IV SOSY
PREFILLED_SYRINGE | INTRAVENOUS | Status: AC
  Filled 2022-03-23: qty 10

## 2022-03-23 MED ORDER — PROPOFOL 10 MG/ML IV BOLUS
INTRAVENOUS | Status: AC
  Filled 2022-03-23: qty 20

## 2022-03-23 MED ORDER — DEXAMETHASONE SODIUM PHOSPHATE 10 MG/ML IJ SOLN
INTRAMUSCULAR | Status: DC | PRN
  Administered 2022-03-23: 10 mg via INTRAVENOUS

## 2022-03-23 MED ORDER — ONDANSETRON HCL 4 MG/2ML IJ SOLN
INTRAMUSCULAR | Status: DC | PRN
  Administered 2022-03-23: 4 mg via INTRAVENOUS

## 2022-03-23 MED ORDER — SUGAMMADEX SODIUM 200 MG/2ML IV SOLN
INTRAVENOUS | Status: DC | PRN
  Administered 2022-03-23: 200 mg via INTRAVENOUS

## 2022-03-23 NOTE — Interval H&P Note (Signed)
History and Physical Interval Note:  03/23/2022 9:37 AM  Erika Burch  has presented today for surgery, with the diagnosis of HISTORY OF ADENOMATOUS POLYP OF COLON.  The various methods of treatment have been discussed with the patient and family. After consideration of risks, benefits and other options for treatment, the patient has consented to  Procedure(s) with comments: COLONOSCOPY WITH PROPOFOL (N/A) - DM as a surgical intervention.  The patient's history has been reviewed, patient examined, no change in status, stable for surgery.  I have reviewed the patient's chart and labs.  Questions were answered to the patient's satisfaction.     Lesly Rubenstein  Ok to proceed with colonoscopy. Patient will be intubated due to ozempic per anesthesia.

## 2022-03-23 NOTE — Anesthesia Procedure Notes (Signed)
Procedure Name: Intubation Date/Time: 03/23/2022 9:44 AM  Performed by: Natasha Mead, CRNAPre-anesthesia Checklist: Patient identified, Emergency Drugs available, Suction available and Patient being monitored Patient Re-evaluated:Patient Re-evaluated prior to induction Oxygen Delivery Method: Circle system utilized Preoxygenation: Pre-oxygenation with 100% oxygen Induction Type: IV induction Ventilation: Mask ventilation without difficulty Laryngoscope Size: McGraph and 3 Grade View: Grade I Tube type: Oral Tube size: 6.5 mm Number of attempts: 1 Airway Equipment and Method: Stylet and Oral airway Placement Confirmation: ETT inserted through vocal cords under direct vision, positive ETCO2 and breath sounds checked- equal and bilateral Secured at: 20 cm Tube secured with: Tape Dental Injury: Teeth and Oropharynx as per pre-operative assessment  Difficulty Due To: Difficulty was anticipated Comments: Pt morbidly obese and positioning not ideal (on stretcher in GI) but grade 1 view with mcgraph

## 2022-03-23 NOTE — Anesthesia Preprocedure Evaluation (Signed)
Anesthesia Evaluation  Patient identified by MRN, date of birth, ID band Patient awake    Reviewed: Allergy & Precautions, NPO status , Patient's Chart, lab work & pertinent test results  History of Anesthesia Complications Negative for: history of anesthetic complications  Airway Mallampati: III  TM Distance: >3 FB Neck ROM: full    Dental  (+) Teeth Intact   Pulmonary shortness of breath and with exertion, sleep apnea , neg COPD, former smoker,    Pulmonary exam normal        Cardiovascular hypertension, (-) angina(-) CAD, (-) Past MI and (-) CABG negative cardio ROS Normal cardiovascular exam     Neuro/Psych PSYCHIATRIC DISORDERS negative neurological ROS     GI/Hepatic negative GI ROS, Neg liver ROS,   Endo/Other  negative endocrine ROS  Renal/GU      Musculoskeletal   Abdominal   Peds  Hematology negative hematology ROS (+)   Anesthesia Other Findings Patient took ozempic on Saturday. Per ASA guidelines, will intubate patient to decrease aspiration risk.   Past Medical History: No date: Chronic fatigue No date: Depression No date: Fibromyalgia No date: Hyperlipidemia No date: Hypertension No date: Menorrhagia No date: Migraines     Comment:  without aura No date: Pre-diabetes No date: Sleep apnea  Past Surgical History: 2000: ABDOMINAL HYSTERECTOMY     Comment:  TAH, has ovaries No date: CESAREAN SECTION No date: TONSILLECTOMY 2010: TOTAL HIP ARTHROPLASTY; Right  BMI    Body Mass Index: 41.02 kg/m      Reproductive/Obstetrics negative OB ROS                             Anesthesia Physical Anesthesia Plan  ASA: 3  Anesthesia Plan: General   Post-op Pain Management: Minimal or no pain anticipated   Induction: Intravenous  PONV Risk Score and Plan: 3 and Propofol infusion and Ondansetron  Airway Management Planned: Oral ETT  Additional Equipment:  None  Intra-op Plan:   Post-operative Plan:   Informed Consent: I have reviewed the patients History and Physical, chart, labs and discussed the procedure including the risks, benefits and alternatives for the proposed anesthesia with the patient or authorized representative who has indicated his/her understanding and acceptance.     Dental advisory given  Plan Discussed with: CRNA and Surgeon  Anesthesia Plan Comments: (Discussed risks of anesthesia with patient, including possibility of difficulty with spontaneous ventilation under anesthesia necessitating airway intervention, PONV, and rare risks such as cardiac or respiratory or neurological events, and allergic reactions. Discussed the role of CRNA in patient's perioperative care. Patient understands.)        Anesthesia Quick Evaluation

## 2022-03-23 NOTE — Anesthesia Postprocedure Evaluation (Signed)
Anesthesia Post Note  Patient: Erika Burch  Procedure(s) Performed: COLONOSCOPY WITH PROPOFOL  Patient location during evaluation: Endoscopy Anesthesia Type: General Level of consciousness: awake and alert Pain management: pain level controlled Vital Signs Assessment: post-procedure vital signs reviewed and stable Respiratory status: spontaneous breathing, nonlabored ventilation, respiratory function stable and patient connected to nasal cannula oxygen Cardiovascular status: blood pressure returned to baseline and stable Postop Assessment: no apparent nausea or vomiting Anesthetic complications: yes   Encounter Notable Events  Notable Event Outcome Phase Comment  Difficult to intubate - expected  Intraprocedure Filed from anesthesia note documentation.     Last Vitals:  Vitals:   03/23/22 0921 03/23/22 1033  BP: 125/62 138/76  Pulse: 74   Resp: 18   Temp: (!) 35.8 C (!) 36.2 C  SpO2: 95% 98%    Last Pain:  Vitals:   03/23/22 1033  TempSrc: Temporal  PainSc: 0-No pain                 Dimas Millin

## 2022-03-23 NOTE — H&P (Signed)
Outpatient short stay form Pre-procedure 03/23/2022  Erika Rubenstein, MD  Primary Physician: Hilton Sinclair, PA-C  Reason for visit:  Surveillance colonoscopy  History of present illness:    66 y/o lady with history of obesity and hypertension here for surveillance colonoscopy. Last colonoscopy in 2018 with two Ta's. No blood thinners. No family history of GI malignancies. No significant abdominal surgeries.    Current Facility-Administered Medications:    0.9 %  sodium chloride infusion, , Intravenous, Continuous, Eivan Gallina, Hilton Cork, MD  Medications Prior to Admission  Medication Sig Dispense Refill Last Dose   amLODipine (NORVASC) 5 MG tablet Take 5 mg by mouth daily.   03/22/2022   aspirin 81 MG chewable tablet Chew by mouth daily.   03/22/2022   atorvastatin (LIPITOR) 20 MG tablet Take 1 tablet (20 mg total) by mouth daily. NEEDS OFFICE VISIT/LABS FOR REFILLS 30 tablet 0 03/22/2022   buPROPion (WELLBUTRIN) 75 MG tablet Take 75 mg by mouth daily.   03/22/2022   clonazePAM (KLONOPIN) 0.5 MG tablet 1/2 tablet 3 times daily 135 tablet 1 03/22/2022   DULoxetine (CYMBALTA) 60 MG capsule Take 60 mg by mouth daily.   03/22/2022   hydrochlorothiazide (HYDRODIURIL) 25 MG tablet Take 25 mg by mouth daily.   03/22/2022   loratadine (CLARITIN) 10 MG tablet Take 10 mg by mouth daily.   03/22/2022   LOSARTAN POTASSIUM PO Take 40 mg by mouth daily.   03/22/2022   Semaglutide (OZEMPIC, 0.25 OR 0.5 MG/DOSE, [place]) Inject 0.5 mg into the skin 7 days.   03/20/2022   ergocalciferol (VITAMIN D2) 50000 UNITS capsule Take 50,000 Units by mouth every 30 (thirty) days.      fluticasone (FLONASE) 50 MCG/ACT nasal spray Place into both nostrils daily.      folic acid (FOLVITE) 626 MCG tablet Take 400 mcg by mouth daily.      HYDROcodone-acetaminophen (VICODIN) 5-500 MG per tablet Take 1 tablet by mouth every 6 (six) hours as needed.      Magnesium 250 MG TABS Take 250 mg by mouth 2 (two) times daily.       metroNIDAZOLE (FLAGYL) 500 MG tablet Take 1 tablet (500 mg total) by mouth 2 (two) times daily. (Patient not taking: Reported on 09/28/2018) 14 tablet 0    metroNIDAZOLE (METROGEL VAGINAL) 0.75 % vaginal gel Place 1 Applicatorful vaginally 2 (two) times daily. (Patient not taking: Reported on 09/28/2018) 70 g 0    nystatin cream (MYCOSTATIN) Apply 1 application topically 2 (two) times daily. 30 g 1    nystatin-triamcinolone ointment (MYCOLOG) Apply 1 application topically 2 (two) times daily. (Patient not taking: Reported on 09/28/2018) 30 g 0    valACYclovir (VALTREX) 500 MG tablet TAKE 1 TABLET BY MOUTH AS NEEDED 30 tablet 0      Allergies  Allergen Reactions   Codeine    Losartan Cough   Nickel Itching   Other     opiates   Sulfa Antibiotics Itching   Tramadol Other (See Comments)    hallucinations     Past Medical History:  Diagnosis Date   Chronic fatigue    Depression    Fibromyalgia    Hyperlipidemia    Hypertension    Menorrhagia    Migraines    without aura   Pre-diabetes    Sleep apnea     Review of systems:  Otherwise negative.    Physical Exam  Gen: Alert, oriented. Appears stated age.  HEENT: PERRLA. Lungs: No respiratory distress CV:  RRR Abd: soft, benign, no masses Ext: No edema    Planned procedures: Proceed with colonoscopy. The patient understands the nature of the planned procedure, indications, risks, alternatives and potential complications including but not limited to bleeding, infection, perforation, damage to internal organs and possible oversedation/side effects from anesthesia. The patient agrees and gives consent to proceed.  Please refer to procedure notes for findings, recommendations and patient disposition/instructions.     Erika Rubenstein, MD [facility]

## 2022-03-23 NOTE — Transfer of Care (Signed)
Immediate Anesthesia Transfer of Care Note  Patient: Erika Burch  Procedure(s) Performed: COLONOSCOPY WITH PROPOFOL  Patient Location: PACU  Anesthesia Type:General  Level of Consciousness: awake, alert  and oriented  Airway & Oxygen Therapy: Patient Spontanous Breathing and Patient connected to nasal cannula oxygen  Post-op Assessment: Report given to RN and Post -op Vital signs reviewed and stable  Post vital signs: stable  Last Vitals:  Vitals Value Taken Time  BP    Temp    Pulse 80 03/23/22 1018  Resp 18 03/23/22 1018  SpO2 95 % 03/23/22 1018  Vitals shown include unvalidated device data.  Last Pain:  Vitals:   03/23/22 1017  TempSrc:   PainSc: 0-No pain         Complications:  Encounter Notable Events  Notable Event Outcome Phase Comment  Difficult to intubate - expected  Intraprocedure Filed from anesthesia note documentation.

## 2022-03-23 NOTE — Op Note (Signed)
[facility] Patient Name: Erika Burch Procedure Date: 03/23/2022 9:30 AM MRN: [id number] Account #: [id number] Date of Birth: 06-04-1956 Admit Type: Outpatient Age: 66 Room: [facility] ENDO ROOM 1 Gender: Female Note Status: Finalized Instrument Name: Jasper Riling [id number] Procedure:             Colonoscopy Indications:           Surveillance: Personal history of adenomatous polyps                         on last colonoscopy 5 years ago Providers:             Andrey Farmer MD, MD Referring MD:          No Local Md, MD (Referring MD) Medicines:             General Anesthesia Complications:         No immediate complications. Estimated blood loss:                         Minimal. Procedure:             Pre-Anesthesia Assessment:                        - Prior to the procedure, a History and Physical was                         performed, and patient medications and allergies were                         reviewed. The patient is competent. The risks and                         benefits of the procedure and the sedation options and                         risks were discussed with the patient. All questions                         were answered and informed consent was obtained.                         Patient identification and proposed procedure were                         verified by the physician, the nurse, the                         anesthesiologist, the anesthetist and the technician                         in the endoscopy suite. Mental Status Examination:                         alert and oriented. Airway Examination: normal                         oropharyngeal airway and neck mobility. Respiratory  Examination: clear to auscultation. CV Examination:                         normal. Prophylactic Antibiotics: The patient does not                         require prophylactic antibiotics. Prior                          Anticoagulants: The patient has taken no previous                         anticoagulant or antiplatelet agents. ASA Grade                         Assessment: III - A patient with severe systemic                         disease. After reviewing the risks and benefits, the                         patient was deemed in satisfactory condition to                         undergo the procedure. The anesthesia plan was to use                         monitored anesthesia care (MAC). Immediately prior to                         administration of medications, the patient was                         re-assessed for adequacy to receive sedatives. The                         heart rate, respiratory rate, oxygen saturations,                         blood pressure, adequacy of pulmonary ventilation, and                         response to care were monitored throughout the                         procedure. The physical status of the patient was                         re-assessed after the procedure.                        After obtaining informed consent, the colonoscope was                         passed under direct vision. Throughout the procedure,                         the patient's blood pressure, pulse, and oxygen  saturations were monitored continuously. The                         Colonoscope was introduced through the anus and                         advanced to the the cecum, identified by appendiceal                         orifice and ileocecal valve. The colonoscopy was                         performed without difficulty. The patient tolerated                         the procedure well. The quality of the bowel                         preparation was good. Findings:      The perianal and digital rectal examinations were normal.      A 3 mm polyp was found in the transverse colon. The polyp was sessile.       The polyp was removed with a cold snare. Resection and  retrieval were       complete. Estimated blood loss was minimal.      A 2 mm polyp was found in the rectum. The polyp was sessile. The polyp       was removed with a cold snare. Resection and retrieval were complete.       Estimated blood loss was minimal.      Internal hemorrhoids were found during retroflexion. The hemorrhoids       were Grade I (internal hemorrhoids that do not prolapse).      The exam was otherwise without abnormality on direct and retroflexion       views. Impression:            - One 3 mm polyp in the transverse colon, removed with                         a cold snare. Resected and retrieved.                        - One 2 mm polyp in the rectum, removed with a cold                         snare. Resected and retrieved.                        - Internal hemorrhoids.                        - The examination was otherwise normal on direct and                         retroflexion views. Recommendation:        - Discharge patient to home.                        - Resume previous diet.                        -  Continue present medications.                        - Await pathology results.                        - Repeat colonoscopy in 7 years for surveillance.                        - Return to referring physician as previously                         scheduled. Procedure Code(s):     --- Professional ---                        [phone], Colonoscopy, flexible; with removal of                         tumor(s), polyp(s), or other lesion(s) by snare                         technique Diagnosis Code(s):     --- Professional ---                        Z86.010, Personal history of colonic polyps                        K63.5, Polyp of colon                        K62.1, Rectal polyp                        K64.0, First degree hemorrhoids CPT copyright 2019 [employer]. All rights reserved. The codes documented in this report are preliminary and upon coder  review may  be revised to meet current compliance requirements. Andrey Farmer MD, MD 03/23/2022 10:16:25 AM Number of Addenda: 0 Note Initiated On: 03/23/2022 9:30 AM Scope Withdrawal Time: 0 hours 9 minutes 47 seconds  Total Procedure Duration: 0 hours 17 minutes 37 seconds  Estimated Blood Loss:  Estimated blood loss was minimal.      [facility]

## 2022-03-24 ENCOUNTER — Encounter: Payer: Self-pay | Admitting: Gastroenterology

## 2022-03-24 LAB — SURGICAL PATHOLOGY

## 2022-11-23 ENCOUNTER — Other Ambulatory Visit: Payer: Self-pay | Admitting: Family Medicine

## 2022-11-23 DIAGNOSIS — Z1231 Encounter for screening mammogram for malignant neoplasm of breast: Secondary | ICD-10-CM

## 2023-01-04 ENCOUNTER — Other Ambulatory Visit: Payer: Self-pay | Admitting: Family Medicine

## 2023-01-04 DIAGNOSIS — Y92009 Unspecified place in unspecified non-institutional (private) residence as the place of occurrence of the external cause: Secondary | ICD-10-CM

## 2023-01-04 DIAGNOSIS — M542 Cervicalgia: Secondary | ICD-10-CM

## 2023-01-12 ENCOUNTER — Ambulatory Visit
Admission: RE | Admit: 2023-01-12 | Discharge: 2023-01-12 | Disposition: A | Discharge status: Home / Self Care | Payer: Medicare HMO | Source: Ambulatory Visit | Attending: Family Medicine | Admitting: Family Medicine

## 2023-01-12 DIAGNOSIS — Y92009 Unspecified place in unspecified non-institutional (private) residence as the place of occurrence of the external cause: Secondary | ICD-10-CM | POA: Diagnosis not present

## 2023-01-12 DIAGNOSIS — M542 Cervicalgia: Secondary | ICD-10-CM

## 2023-01-12 DIAGNOSIS — W19XXXS Unspecified fall, sequela: Secondary | ICD-10-CM | POA: Insufficient documentation

## 2023-03-11 ENCOUNTER — Ambulatory Visit
Admission: RE | Admit: 2023-03-11 | Discharge: 2023-03-11 | Disposition: A | Discharge status: Home / Self Care | Payer: Medicare HMO | Source: Ambulatory Visit | Attending: Family Medicine | Admitting: Family Medicine

## 2023-03-11 DIAGNOSIS — Z1231 Encounter for screening mammogram for malignant neoplasm of breast: Secondary | ICD-10-CM | POA: Diagnosis not present
